# Patient Record
Sex: Male | Born: 1935 | Race: Black or African American | Hispanic: No | Smoking: Former smoker
Health system: Southern US, Community
[De-identification: ages and names within clinical notes are randomized; demographics above are authoritative.]

## PROBLEM LIST (undated history)

## (undated) DIAGNOSIS — E119 Type 2 diabetes mellitus without complications: Secondary | ICD-10-CM

## (undated) DIAGNOSIS — I10 Essential (primary) hypertension: Secondary | ICD-10-CM

## (undated) DIAGNOSIS — I509 Heart failure, unspecified: Secondary | ICD-10-CM

## (undated) DIAGNOSIS — K219 Gastro-esophageal reflux disease without esophagitis: Secondary | ICD-10-CM

## (undated) DIAGNOSIS — H409 Unspecified glaucoma: Secondary | ICD-10-CM

## (undated) HISTORY — DX: Unspecified glaucoma: H40.9

## (undated) HISTORY — PX: ABDOMINAL AORTIC ANEURYSM REPAIR: SUR1152

## (undated) HISTORY — DX: Gastro-esophageal reflux disease without esophagitis: K21.9

---

## 2001-11-14 ENCOUNTER — Emergency Department (HOSPITAL_COMMUNITY): Admission: EM | Admit: 2001-11-14 | Discharge: 2001-11-14 | Payer: Self-pay | Admitting: Emergency Medicine

## 2002-11-26 LAB — HM COLONOSCOPY

## 2006-01-14 ENCOUNTER — Emergency Department: Payer: Self-pay | Admitting: Emergency Medicine

## 2006-01-31 ENCOUNTER — Ambulatory Visit: Payer: Self-pay | Admitting: Urology

## 2012-05-25 ENCOUNTER — Emergency Department: Payer: Self-pay | Admitting: Emergency Medicine

## 2013-08-16 ENCOUNTER — Ambulatory Visit: Payer: Self-pay | Admitting: Vascular Surgery

## 2013-08-22 ENCOUNTER — Other Ambulatory Visit: Payer: Self-pay | Admitting: Vascular Surgery

## 2013-08-22 LAB — BASIC METABOLIC PANEL
Chloride: 101 mmol/L (ref 98–107)
Co2: 28 mmol/L (ref 21–32)
Creatinine: 1.49 mg/dL — ABNORMAL HIGH (ref 0.60–1.30)
EGFR (Non-African Amer.): 43 — ABNORMAL LOW
Glucose: 271 mg/dL — ABNORMAL HIGH (ref 65–99)
Potassium: 3.3 mmol/L — ABNORMAL LOW (ref 3.5–5.1)

## 2013-09-05 ENCOUNTER — Ambulatory Visit: Payer: Self-pay | Admitting: Vascular Surgery

## 2013-09-26 ENCOUNTER — Ambulatory Visit: Payer: Self-pay | Admitting: Vascular Surgery

## 2013-09-26 LAB — CBC
HGB: 12.2 g/dL — ABNORMAL LOW (ref 13.0–18.0)
MCH: 31.1 pg (ref 26.0–34.0)
MCHC: 33.5 g/dL (ref 32.0–36.0)
MCV: 93 fL (ref 80–100)

## 2013-09-26 LAB — BASIC METABOLIC PANEL
BUN: 12 mg/dL (ref 7–18)
Calcium, Total: 9.4 mg/dL (ref 8.5–10.1)
Chloride: 102 mmol/L (ref 98–107)
Co2: 30 mmol/L (ref 21–32)
EGFR (Non-African Amer.): 43 — ABNORMAL LOW
Potassium: 3.1 mmol/L — ABNORMAL LOW (ref 3.5–5.1)

## 2013-10-02 ENCOUNTER — Inpatient Hospital Stay: Payer: Self-pay | Admitting: Vascular Surgery

## 2013-10-02 LAB — BASIC METABOLIC PANEL
Anion Gap: 4 — ABNORMAL LOW (ref 7–16)
Calcium, Total: 8.5 mg/dL (ref 8.5–10.1)
Co2: 27 mmol/L (ref 21–32)
EGFR (African American): >60
EGFR (Non-African Amer.): 58 — ABNORMAL LOW
Glucose: 220 mg/dL — ABNORMAL HIGH (ref 65–99)
Osmolality: 282 (ref 275–301)
Potassium: 3.4 mmol/L — ABNORMAL LOW (ref 3.5–5.1)
Sodium: 138 mmol/L (ref 136–145)

## 2013-10-02 LAB — GLUCOSE, RANDOM: Glucose: 250 mg/dL — ABNORMAL HIGH (ref 65–99)

## 2013-10-03 LAB — PHOSPHORUS: Phosphorus: 2.7 mg/dL (ref 2.5–4.9)

## 2013-10-03 LAB — CBC WITH DIFFERENTIAL/PLATELET
Basophil #: 0 10*3/uL (ref 0.0–0.1)
Basophil %: 0.3 %
Eosinophil #: 0 10*3/uL (ref 0.0–0.7)
Eosinophil %: 0.2 %
HGB: 11.4 g/dL — ABNORMAL LOW (ref 13.0–18.0)
Lymphocyte %: 10.9 %
MCH: 31.3 pg (ref 26.0–34.0)
Monocyte %: 24.2 %
Neutrophil #: 4.6 10*3/uL (ref 1.4–6.5)
Neutrophil %: 64.4 %
RBC: 3.65 10*6/uL — ABNORMAL LOW (ref 4.40–5.90)
RDW: 12.7 % (ref 11.5–14.5)
WBC: 7.1 10*3/uL (ref 3.8–10.6)

## 2013-10-03 LAB — COMPREHENSIVE METABOLIC PANEL
Albumin: 3.1 g/dL — ABNORMAL LOW (ref 3.4–5.0)
Alkaline Phosphatase: 82 U/L (ref 50–136)
Anion Gap: 7 (ref 7–16)
Calcium, Total: 8.3 mg/dL — ABNORMAL LOW (ref 8.5–10.1)
Chloride: 105 mmol/L (ref 98–107)
Co2: 27 mmol/L (ref 21–32)
EGFR (African American): >60
Glucose: 229 mg/dL — ABNORMAL HIGH (ref 65–99)
Osmolality: 283 (ref 275–301)
SGOT(AST): 19 U/L (ref 15–37)
SGPT (ALT): 17 U/L (ref 12–78)
Sodium: 139 mmol/L (ref 136–145)
Total Protein: 6.7 g/dL (ref 6.4–8.2)

## 2013-10-03 LAB — APTT: Activated PTT: 30.7 secs (ref 23.6–35.9)

## 2013-10-03 LAB — PROTIME-INR: Prothrombin Time: 14.4 secs (ref 11.5–14.7)

## 2014-06-16 LAB — CBC AND DIFFERENTIAL
HCT: 36 % — AB (ref 41–53)
Hemoglobin: 11.8 g/dL — AB (ref 13.5–17.5)
Neutrophils Absolute: 2 /uL
Platelets: 113 10*3/uL — AB (ref 150–399)
WBC: 4.7 10*3/mL

## 2014-06-16 LAB — HEPATIC FUNCTION PANEL
ALT: 17 U/L (ref 10–40)
AST: 20 U/L (ref 14–40)
Alkaline Phosphatase: 75 U/L (ref 25–125)

## 2014-06-16 LAB — LIPID PANEL
Cholesterol: 184 mg/dL (ref 0–200)
HDL: 31 mg/dL — AB (ref 35–70)
LDL Cholesterol: 131 mg/dL
LDl/HDL Ratio: 4.2
Triglycerides: 108 mg/dL (ref 40–160)

## 2014-06-16 LAB — TSH: TSH: 1.49 u[IU]/mL (ref 0.41–5.90)

## 2014-06-16 LAB — BASIC METABOLIC PANEL: BUN: 19 mg/dL (ref 4–21)

## 2014-08-25 LAB — BASIC METABOLIC PANEL
CREATININE: 1.5 mg/dL — AB (ref <=1.3)
GLUCOSE: 215 mg/dL
Potassium: 3.5 mmol/L (ref 3.4–5.3)
Sodium: 139 mmol/L (ref 137–147)

## 2014-11-07 ENCOUNTER — Ambulatory Visit: Payer: Self-pay | Admitting: Family Medicine

## 2015-02-23 LAB — HEMOGLOBIN A1C: Hgb A1c MFr Bld: 7.1 % — AB (ref 4.0–6.0)

## 2015-03-13 NOTE — Op Note (Signed)
PATIENT NAME:  Aaron Rivera, [name] MR#:  [id number] DATE OF BIRTH:  May 30, 1930  DATE OF PROCEDURE:  10/02/2013  PREOPERATIVE DIAGNOSIS:  Abdominal aortic aneurysm.   POSTOPERATIVE DIAGNOSIS:  Abdominal aortic aneurysm.    PROCEDURES: 1.  Ultrasound guidance for vascular access to bilateral femoral arteries, right by Dr. Gilda CreaseSchnier, left by Dr. Wyn Quakerew.  2.  Catheter placement to aorta from bilateral femoral arteries, right by Dr. Gilda CreaseSchnier and left by Dr. Wyn Quakerew.  3.  Placement of a Gore Excluder endoprosthesis, 28 mm diameter proximal, 18 cm length, main body left co-surgeons.  4.  Placement of a right contralateral limb and then a right iliac extender, co-surgeons.  5.  Perclose closure device bilateral femoral arteries, right by Dr. Gilda CreaseSchnier and left by Dr. Wyn Quakerew.    CO-SURGEONS:  Annice NeedyJason S. Dreana Britz, MD, and Renford DillsGregory G. Schnier, MD  ANESTHESIA: General.   ESTIMATED BLOOD LOSS: 100 mL.   INDICATION FOR PROCEDURE: This is a gentleman with a greater than 5 cm abdominal aortic aneurysm. He had acceptable anatomy for endovascular repair.   DESCRIPTION OF PROCEDURE: The patient is brought to the vascular interventional radiology suite with the help of our anesthesia colleagues providing a general anesthetic.  His abdomen and groins were sterilely prepped and draped, and a sterile surgical field was created. Ultrasound was used to visualize the femoral arteries bilaterally. Dr. Gilda CreaseSchnier accessed the right and I accessed the left, and 6-French sheaths were placed. We used the Perclose technique, placing 2 Proglides on both femoral arteries and then up-sizing to 8-French sheaths. A pigtail catheter was placed and an AP aortogram was performed. We then selected the left for a primary site. An 18-French sheath was placed over an Amplatz Super Stiff wire and the main body device was placed up the left side. Pictures from the pigtail catheter from the right femoral sheath allowed us to place the stent graft just below the renal  arteries with the right being slightly lower. Dr. Gilda CreaseSchnier then cannulated the contralateral gate without difficulty, twirling the pigtail catheter to confirm successful cannulation.  He extended the right iliac limb but this was still about 3 to 4 cm above the hypogastric artery and a separate right iliac extension device had to be placed.  This was a 16 mm diameter device.   All junction points and seal zones were ironed out with a compliant balloon. Initially, there appeared to be an indeterminate endoleak. With re-angioplasty of the proximal seal zone, this appeared to disappear and at this point, we elected to terminate the procedure. The sheaths were removed. Additional Proglides were placed to gain hemostasis was well a StarClose device on the. Right pressure was held. Sterile dressings were placed. The patient tolerated the procedure well and was taken to the recovery room in stable condition.    ____________________________ Annice NeedyJason S. Axzel Rockhill, MD jsd:cs D: 10/02/2013 14:11:52 ET T: 10/02/2013 15:45:13 ET JOB#: [id number]  cc: Annice NeedyJason S. Avi Kerschner, MD, <Dictator> Annice NeedyJASON S Vincent Ehrler MD ELECTRONICALLY SIGNED 10/03/2013 14:48

## 2015-03-13 NOTE — Op Note (Signed)
PATIENT NAME:  Sallye OberLINDSAY, Delmus H MR#:  [id number] DATE OF BIRTH:  1930/10/02  DATE OF PROCEDURE:  10/02/2013  PREOPERATIVE DIAGNOSES:   Abdominal aortic aneurysm.   POSTOPERATIVE DIAGNOSIS: Abdominal aortic aneurysm.  PROCEDURES PERFORMED: 1.  Repair of abdominal aortic aneurysm using a Gore Excluder endoprosthesis stent graft 28 mm x 18 mm x 16 mm.  2.  Placement of right contralateral limb 14 x 16.  3.  Extension right limb with a 7 x 16 limb.  4.  Introduction catheter to aorta bilateral femoral artery approach percutaneously.  5.  Ultrasound guidance for vascular access, bilateral common femoral arteries.  6.  Perclose device bilateral femoral puncture sites.   CO-SURGEONS:   Dr. Gilda CreaseSchnier and Dr. Wyn Quakerew.   ANESTHESIA: General by LMA.   FLUIDS: Per anesthesia record.   ESTIMATED BLOOD LOSS: Minimal.   SPECIMEN: None.   CONTRAST USED: Isovue 90 mL.   FLUOROSCOPY TIME: 10.9 minutes.   INDICATIONS: Mr. Lillia AbedLindsay is an 81 year old gentleman who presented with a large aneurysm greater than 6 cm. The risks and benefits for endovascular repair were reviewed. Alternative therapies were discussed. All questions have been answered. The patient has agreed to proceed with endovascular aneurysm repair.   DESCRIPTION OF PROCEDURE: The patient is taken to special procedures, where he is positioned supine, and after adequate general anesthesia has been induced and appropriate invasive monitors have been placed, he is prepped from the nipple line to the knees and then draped in a sterile fashion. Appropriate timeout is called.   Ultrasound is placed in a sterile sleeve. Ultrasound is being utilized secondary to lack of appropriate landmarks and to avoid vascular injury. Under direct ultrasound visualization, the common femoral arteries are identified. They are echolucent and pulsatile indicating patency. Right groin is addressed first, and a micropuncture needle was used to access the anterior wall of  the common femoral artery, microwire followed by micro sheath, J-wire followed by a 6-French sheath. In a similar fashion, Dr. Wyn Quakerew performed access on the left common femoral.   J-wires are then advanced and Perclose devices are used in a pre-close fashion, taking the first device and rotating it slightly to the 11 o'clock position. This is marked with a curved hemostat. The second device is then placed over the wire and rotated to the two o'clock position and marked with a straight hemostat. Again, this was performed simultaneously, both right and left with myself working on the right and Dr. Wyn Quakerew working on the left.  8-French sheaths are then advanced over the wires.   The pigtail catheter is then advanced up the right side, and an AP projection of the aorta is obtained. Final length measurements are made. 6000 of heparin is given, and after it has circulated for 3 minutes, an Amplatz Super Stiff wire is advanced up the left sheath. A main body Gore Excluder 28 x 18 x 16 is then selected. It is advanced up to the level of the renals. Magnified images are obtained at the level of the renals, and the device is repositioned. It is then opened proximally with an excellent position. It is then secured down to the contralateral gate. Pigtail catheter was subsequently removed and exchanged over a Glidewire for a KMP catheter. Working from the right side, the gate was cannulated. Pigtail catheter was twirled within the main body and subsequently the Amplatz Super Stiff wire is advanced up the right. It should be noted that once the Amplatz was placed on the left, the 18-French  sheath was advanced and exchanged for the 8-French, and now in a similar fashion, the 8-French sheath on the right is exchanged for a 12-French sheath. The contralateral limb is selected. It is a 14 length x 16 diameter, and this is deployed at the level of the gate extending down. Magnified oblique view from the right sheath demonstrates that  it is extending into the common iliac by approximately 2 cm, and there is approximately 4 cm of common iliac proximal to the bifurcation, which is uncovered, and therefore, in order to attain a more secure seal zone, an additional 7 length x 16 limb is advanced and deployed, bringing the end of the stent graft down to the level of the iliac bifurcation on the right. Coda balloon is then used to seal proximally, as well as the right contralateral limbs. Coda balloon is then advanced up the left groin, and again, used to seal proximally, and then the left limb.   Followup angiography demonstrates what appears to be a slight blush, and the Coda balloon is advanced up the left side, and the proximal is re-angioplastied.  Followup imaging demonstrates resolution of this area in question. There is a very small type II endoleak noted, which is quite delayed and appears to be secondary to 2 lumbars.   The left sheath is then removed and the ProGlide device is secured. In order to achieve adequate hemostasis, approximately 3 more ProGlides were utilized, but ultimately with light pressure for approximately 10 minutes, hemostasis is achieved on the left. On the right, the second ProGlide worked well. The first one did not. Attempts at placing further ProGlides are unsuccessful. A StarClose device was also attempted, but this are unsuccessful, and it was elected to hold pressure for approximately 25 minutes. At the conclusion of this, there was excellent hemostasis. Safeguard devices were placed bilaterally. The patient tolerated the procedure well. There were no immediate complications. Sponge and needle counts were correct, and he was taken to the recovery area in stable condition.     ____________________________ Renford Dills, MD ggs:dmm D: 10/04/2013 10:23:00 ET T: 10/04/2013 10:54:36 ET JOB#: [id number]  cc: Renford Dills, MD, <Dictator> Renford Dills MD ELECTRONICALLY SIGNED 10/28/2013 17:15

## 2015-03-20 ENCOUNTER — Ambulatory Visit
Admit: 2015-03-20 | Disposition: A | Discharge status: Home / Self Care | Payer: Self-pay | Attending: Otolaryngology | Admitting: Otolaryngology

## 2015-03-25 ENCOUNTER — Other Ambulatory Visit: Payer: Self-pay | Admitting: Otolaryngology

## 2015-03-25 DIAGNOSIS — E041 Nontoxic single thyroid nodule: Secondary | ICD-10-CM

## 2015-06-16 DIAGNOSIS — I10 Essential (primary) hypertension: Secondary | ICD-10-CM | POA: Insufficient documentation

## 2015-06-16 DIAGNOSIS — I714 Abdominal aortic aneurysm, without rupture, unspecified: Secondary | ICD-10-CM | POA: Insufficient documentation

## 2015-06-16 DIAGNOSIS — M1990 Unspecified osteoarthritis, unspecified site: Secondary | ICD-10-CM | POA: Insufficient documentation

## 2015-06-16 DIAGNOSIS — Q245 Malformation of coronary vessels: Secondary | ICD-10-CM | POA: Insufficient documentation

## 2015-06-16 DIAGNOSIS — I351 Nonrheumatic aortic (valve) insufficiency: Secondary | ICD-10-CM | POA: Insufficient documentation

## 2015-06-16 DIAGNOSIS — J309 Allergic rhinitis, unspecified: Secondary | ICD-10-CM | POA: Insufficient documentation

## 2015-06-16 DIAGNOSIS — J302 Other seasonal allergic rhinitis: Secondary | ICD-10-CM | POA: Insufficient documentation

## 2015-06-16 DIAGNOSIS — K219 Gastro-esophageal reflux disease without esophagitis: Secondary | ICD-10-CM | POA: Insufficient documentation

## 2015-06-16 DIAGNOSIS — E119 Type 2 diabetes mellitus without complications: Secondary | ICD-10-CM | POA: Insufficient documentation

## 2015-06-16 DIAGNOSIS — E785 Hyperlipidemia, unspecified: Secondary | ICD-10-CM | POA: Insufficient documentation

## 2015-06-16 DIAGNOSIS — N401 Enlarged prostate with lower urinary tract symptoms: Secondary | ICD-10-CM | POA: Insufficient documentation

## 2015-06-16 DIAGNOSIS — N529 Male erectile dysfunction, unspecified: Secondary | ICD-10-CM | POA: Insufficient documentation

## 2015-06-29 ENCOUNTER — Encounter: Payer: Self-pay | Admitting: Family Medicine

## 2015-06-29 ENCOUNTER — Ambulatory Visit (INDEPENDENT_AMBULATORY_CARE_PROVIDER_SITE_OTHER): Payer: Medicare HMO | Admitting: Family Medicine

## 2015-06-29 VITALS — BP 114/52 | HR 84 | Temp 97.8°F | Resp 16 | Ht 72.0 in | Wt 222.0 lb

## 2015-06-29 DIAGNOSIS — E119 Type 2 diabetes mellitus without complications: Secondary | ICD-10-CM | POA: Diagnosis not present

## 2015-06-29 DIAGNOSIS — Z658 Other specified problems related to psychosocial circumstances: Secondary | ICD-10-CM | POA: Diagnosis not present

## 2015-06-29 DIAGNOSIS — R1310 Dysphagia, unspecified: Secondary | ICD-10-CM

## 2015-06-29 DIAGNOSIS — I10 Essential (primary) hypertension: Secondary | ICD-10-CM

## 2015-06-29 DIAGNOSIS — E785 Hyperlipidemia, unspecified: Secondary | ICD-10-CM | POA: Diagnosis not present

## 2015-06-29 DIAGNOSIS — F439 Reaction to severe stress, unspecified: Secondary | ICD-10-CM

## 2015-06-29 NOTE — Progress Notes (Signed)
Patient ID: Aaron Rivera, male   DOB: 12-21-1929, 81 y.o.   MRN: [id number]   MONG NEAL  MRN: [id number] DOB: Dec 12, 1929  Subjective:  HPI   1. Essential hypertension Patient is an 81 year old male who presents for his 6 month follow up.  His last visit was 02/23/15.  His blood pressure at that time was 106/42.  No changes in management at that time.  2. Type 2 diabetes mellitus without complication Patient is also here to follow up on his diabetes.  His last A1C was on 02/23/15 and was 7.1.  He reports no hypoglycemic symptoms and states his glucose runs about the same all the time and said that it is around 160.  Patient states that the insurance nurse came to his house on 06/25/15 and performed a foot exam and reported it as normal.  3. Dysphagia Patient is on Omeprazole and Ranitidine.  He is uncertain of the exact dose of the Zantac.  He states that he is seeing Dr. Virgia Land and has follow up appointment with him,  4. Hyperlipidemia Patient had his last check 1 year ago.  He is on Pravastatin and reports no difficulties.    5. Stress Patient reports that he has not been doing well lately.  He states that his older brother is dying and it has him worried.   Patient Active Problem List   Diagnosis Date Noted  . AAA (abdominal aortic aneurysm) 06/16/2015  . Allergic rhinitis 06/16/2015  . AI (aortic incompetence) 06/16/2015  . Benign prostatic hypertrophy with lower urinary tract symptoms (LUTS) 06/16/2015  . Coronary artery abnormality 06/16/2015  . ED (erectile dysfunction) of organic origin 06/16/2015  . Essential (primary) hypertension 06/16/2015  . Acid reflux 06/16/2015  . HLD (hyperlipidemia) 06/16/2015  . Arthritis, degenerative 06/16/2015  . Allergic rhinitis, seasonal 06/16/2015  . Diabetes mellitus, type 2 06/16/2015    No past medical history on file.  History   Social History  . Marital Status: Married    Spouse Name: N/A  . Number of Children: N/A  .  Years of Education: N/A   Occupational History  . Not on file.   Social History Main Topics  . Smoking status: Former Research scientist (life sciences)  . Smokeless tobacco: Former Systems developer    Quit date: 11/20/1969  . Alcohol Use: No  . Drug Use: No  . Sexual Activity: Not on file   Other Topics Concern  . Not on file   Social History Narrative    Outpatient Prescriptions Prior to Visit  Medication Sig Dispense Refill  . captopril (CAPOTEN) 25 MG tablet Take by mouth.    . diltiazem (CARTIA XT) 300 MG 24 hr capsule Take by mouth.    . fluticasone (FLONASE) 50 MCG/ACT nasal spray Place into the nose.    . hydrochlorothiazide (HYDRODIURIL) 25 MG tablet Take by mouth.    . meclizine (ANTIVERT) 25 MG tablet Take by mouth.    Marland Kitchen omeprazole (PRILOSEC) 20 MG capsule Take by mouth.    . pioglitazone (ACTOS) 15 MG tablet Take by mouth.    . potassium chloride (KLOR-CON 10) 10 MEQ tablet Take by mouth.    . pravastatin (PRAVACHOL) 40 MG tablet Take by mouth.    . ranitidine (ZANTAC) 150 MG tablet Take by mouth.    . terazosin (HYTRIN) 2 MG capsule Take by mouth.     No facility-administered medications prior to visit.    No Known Allergies  Review of Systems  Constitutional: Negative.  Respiratory: Negative.   Cardiovascular: Negative.   Neurological: Negative for dizziness and headaches.   Objective:  BP 114/52 mmHg  Pulse 84  Temp(Src) 97.8 F (36.6 C) (Oral)  Resp 16  Ht 6' (1.829 m)  Wt 222 lb (100.699 kg)  BMI 30.10 kg/m2  Physical Exam  Constitutional: He is well-developed, well-nourished, and in no distress.  Neck: Normal range of motion. Neck supple.  Cardiovascular: Normal rate, regular rhythm and normal heart sounds.   Pulmonary/Chest: Effort normal and breath sounds normal.  Skin: Skin is warm and dry.  Psychiatric: Mood, memory, affect and judgment normal.    Assessment and Plan :   1. Essential hypertension Under very good control, continue present regiment.  CBC, Met C and TSH  pending.  - CBC With Differential/Platelet - COMPLETE METABOLIC PANEL WITH GFR - TSH  2. Type 2 diabetes mellitus without complication Continue present regiment.   A1C results pending. - Hemoglobin A1c Normal recent foot exam by nurse from [facility]. 3. Dysphagia Patient is being seen by Dr. Richardson Landry. This appears to be mainly with pills. No problems with liquids and no problems with solid foods.  4. Hyperlipidemia Continue present regiment.  Lipids pending.  - Lipid Panel With LDL/HDL Ratio  5. Stress Patient with appropriate emotion given the situation with dying brother. Brother is 68. Patient is comfortable with the situation. 6. Status post thoracic aortic aneurysm repair. 7. Claudication Classic claudication reported by patient with pain in the calves bilaterally with walking. This goes away with rest. He is followed by vascular surgery for this. Miguel Aschoff MD [facility] 06/29/2015 8:57 AM

## 2015-07-01 LAB — LIPID PANEL WITH LDL/HDL RATIO
Cholesterol, Total: 180 mg/dL (ref 100–199)
HDL: 30 mg/dL — ABNORMAL LOW (ref >39)
LDL Calculated: 128 mg/dL — ABNORMAL HIGH (ref 0–99)
LDl/HDL Ratio: 4.3 ratio units — ABNORMAL HIGH (ref 0.0–3.6)
Triglycerides: 112 mg/dL (ref 0–149)
VLDL Cholesterol Cal: 22 mg/dL (ref 5–40)

## 2015-07-01 LAB — CBC WITH DIFFERENTIAL/PLATELET
BASOS: 0 %
Basophils Absolute: 0 10*3/uL (ref 0.0–0.2)
EOS (ABSOLUTE): 0 10*3/uL (ref 0.0–0.4)
EOS: 0 %
HEMATOCRIT: 34.9 % — AB (ref 37.5–51.0)
HEMOGLOBIN: 11.1 g/dL — AB (ref 12.6–17.7)
Immature Grans (Abs): 0 10*3/uL (ref 0.0–0.1)
Immature Granulocytes: 0 %
Lymphocytes Absolute: 1.9 10*3/uL (ref 0.7–3.1)
Lymphs: 33 %
MCH: 30.8 pg (ref 26.6–33.0)
MCHC: 31.8 g/dL (ref 31.5–35.7)
MCV: 97 fL (ref 79–97)
Monocytes Absolute: 1.1 10*3/uL — ABNORMAL HIGH (ref 0.1–0.9)
Monocytes: 19 %
NEUTROS ABS: 2.7 10*3/uL (ref 1.4–7.0)
Neutrophils: 48 %
Platelets: 115 10*3/uL — ABNORMAL LOW (ref 150–379)
RBC: 3.6 x10E6/uL — AB (ref 4.14–5.80)
RDW: 13 % (ref 12.3–15.4)
WBC: 5.7 10*3/uL (ref 3.4–10.8)

## 2015-07-01 LAB — HEMOGLOBIN A1C
Est. average glucose Bld gHb Est-mCnc: 143 mg/dL
Hgb A1c MFr Bld: 6.6 % — ABNORMAL HIGH (ref 4.8–5.6)

## 2015-07-01 LAB — TSH: TSH: 2.56 u[IU]/mL (ref 0.450–4.500)

## 2015-08-18 ENCOUNTER — Other Ambulatory Visit: Payer: Self-pay | Admitting: Family Medicine

## 2015-09-07 DIAGNOSIS — E669 Obesity, unspecified: Secondary | ICD-10-CM | POA: Diagnosis not present

## 2015-09-07 DIAGNOSIS — I70213 Atherosclerosis of native arteries of extremities with intermittent claudication, bilateral legs: Secondary | ICD-10-CM | POA: Diagnosis not present

## 2015-09-07 DIAGNOSIS — I70219 Atherosclerosis of native arteries of extremities with intermittent claudication, unspecified extremity: Secondary | ICD-10-CM | POA: Diagnosis not present

## 2015-09-07 DIAGNOSIS — I739 Peripheral vascular disease, unspecified: Secondary | ICD-10-CM | POA: Diagnosis not present

## 2015-09-07 DIAGNOSIS — I714 Abdominal aortic aneurysm, without rupture: Secondary | ICD-10-CM | POA: Diagnosis not present

## 2015-09-07 DIAGNOSIS — E785 Hyperlipidemia, unspecified: Secondary | ICD-10-CM | POA: Diagnosis not present

## 2015-09-07 DIAGNOSIS — I10 Essential (primary) hypertension: Secondary | ICD-10-CM | POA: Diagnosis not present

## 2015-09-07 DIAGNOSIS — I6529 Occlusion and stenosis of unspecified carotid artery: Secondary | ICD-10-CM | POA: Diagnosis not present

## 2015-09-08 ENCOUNTER — Other Ambulatory Visit: Payer: Self-pay | Admitting: Family Medicine

## 2015-10-19 ENCOUNTER — Other Ambulatory Visit: Payer: Self-pay | Admitting: Family Medicine

## 2015-11-02 ENCOUNTER — Ambulatory Visit (INDEPENDENT_AMBULATORY_CARE_PROVIDER_SITE_OTHER): Payer: Medicare HMO | Admitting: Family Medicine

## 2015-11-02 VITALS — BP 114/68 | HR 84 | Temp 98.3°F | Resp 14 | Ht 72.0 in | Wt 227.0 lb

## 2015-11-02 DIAGNOSIS — E119 Type 2 diabetes mellitus without complications: Secondary | ICD-10-CM | POA: Diagnosis not present

## 2015-11-02 DIAGNOSIS — M25512 Pain in left shoulder: Secondary | ICD-10-CM | POA: Diagnosis not present

## 2015-11-02 DIAGNOSIS — I10 Essential (primary) hypertension: Secondary | ICD-10-CM | POA: Diagnosis not present

## 2015-11-02 LAB — POCT GLYCOSYLATED HEMOGLOBIN (HGB A1C): HEMOGLOBIN A1C: 6.6

## 2015-11-02 MED ORDER — GLUCOSE BLOOD VI STRP: 1.0000 | ORAL_STRIP | Freq: Two times a day (BID) | Status: DC

## 2015-11-02 NOTE — Progress Notes (Signed)
Patient ID: Aaron Rivera, male   DOB: 1930-01-07, 81 y.o.   MRN: [id number]   Aaron Rivera  MRN: [id number] DOB: 1930/03/03  Subjective:  HPI   1. Type 2 diabetes mellitus without complication, without long-term current use of insulin [facility]) The patient is an 81 year old male who presents for follow up of his diabetes.  His last visit was on 06/29/15.  No management changes were made at that time.  The patient has been checking his glucose at home daily.  He states that they have been running from 140-175.  He feels these are inaccurate and that his machine is not working correctly.  His last A1C was on 06/29/15 and was 6.6.  The patient is currently on Actos and reports good compliance and tolerance.  2. Essential hypertension The patient is also here to follow up on his blood pressure.  He reports that he does check his pressure occasionally when he is in the pharmacy and feels that it runs ok.  The patient wanted [place] to know that ENT is following him for his thyroid.  When he was hospitalized for his stent placement he had some abnormal findings involving his thyroid and was sent to ENT.  He states that they have not started any medications and he is doing ok with the thyroid now.  He has follow up with them in the near future but, is unsure of the date at this time.   Patient Active Problem List   Diagnosis Date Noted  . AAA (abdominal aortic aneurysm) (HCC) 06/16/2015  . Allergic rhinitis 06/16/2015  . AI (aortic incompetence) 06/16/2015  . Benign prostatic hypertrophy with lower urinary tract symptoms (LUTS) 06/16/2015  . Coronary artery abnormality 06/16/2015  . ED (erectile dysfunction) of organic origin 06/16/2015  . Essential (primary) hypertension 06/16/2015  . Acid reflux 06/16/2015  . HLD (hyperlipidemia) 06/16/2015  . Arthritis, degenerative 06/16/2015  . Allergic rhinitis, seasonal 06/16/2015  . Diabetes mellitus, type 2 (HCC) 06/16/2015    No past medical history on  file.  Social History   Social History  . Marital Status: Married    Spouse Name: N/A  . Number of Children: N/A  . Years of Education: N/A   Occupational History  . Not on file.   Social History Main Topics  . Smoking status: Former Games developer  . Smokeless tobacco: Former Neurosurgeon    Quit date: 11/20/1969  . Alcohol Use: No  . Drug Use: No  . Sexual Activity: Not on file   Other Topics Concern  . Not on file   Social History Narrative    Outpatient Prescriptions Prior to Visit  Medication Sig Dispense Refill  . captopril (CAPOTEN) 25 MG tablet Take by mouth.    . diltiazem (CARTIA XT) 300 MG 24 hr capsule Take by mouth.    . fluticasone (FLONASE) 50 MCG/ACT nasal spray Place into the nose.    . hydrochlorothiazide (HYDRODIURIL) 25 MG tablet Take by mouth.    Marland Kitchen KLOR-CON 10 10 MEQ tablet TAKE 2 TABLETS EVERY DAY 90 tablet 3  . meclizine (ANTIVERT) 25 MG tablet Take by mouth.    Marland Kitchen omeprazole (PRILOSEC) 20 MG capsule Take by mouth.    . pioglitazone (ACTOS) 15 MG tablet Take by mouth.    . pravastatin (PRAVACHOL) 40 MG tablet TAKE 1 TABLET BY MOUTH AT BEDTIME 90 tablet 3  . ranitidine (ZANTAC) 150 MG tablet TAKE 1 TABLET BY MOUTH AT BEDTIME 30 tablet 5  .  terazosin (HYTRIN) 2 MG capsule TAKE ONE CAPSULE BY MOUTH DAILY 30 capsule 11  . doxycycline (ADOXA) 100 MG tablet TAKE 1 TABLET BY MOUTH TWICE A DAY 14 tablet 0   No facility-administered medications prior to visit.    No Known Allergies  Review of Systems  Constitutional: Negative for fever and chills.  Respiratory: Negative for cough, shortness of breath and wheezing.   Cardiovascular: Negative for chest pain, palpitations and leg swelling.  Gastrointestinal: Negative.   Musculoskeletal: Positive for joint pain.       Left shoulder pain with movement of the left shoulder. This does not occur with walking. No other symptoms at this could be cardiovascular/angina.  Neurological: Negative.  Negative for weakness.    Endo/Heme/Allergies: Negative.   Psychiatric/Behavioral: Negative.    Objective:  BP 114/68 mmHg  Pulse 84  Temp(Src) 98.3 F (36.8 C) (Oral)  Resp 14  Ht 6' (1.829 m)  Wt 227 lb (102.967 kg)  BMI 30.78 kg/m2  Physical Exam  Constitutional: He is oriented to person, place, and time and well-developed, well-nourished, and in no distress.  Neck: Normal range of motion. Neck supple.  Cardiovascular: Normal rate, regular rhythm and normal heart sounds.   Pulmonary/Chest: Effort normal and breath sounds normal.  Abdominal: Soft.  Musculoskeletal: He exhibits tenderness. He exhibits no edema.  Mild tenderness over anterior left shoulder. FROM  present  Neurological: He is alert and oriented to person, place, and time. Gait normal.  Skin: Skin is warm and dry.  Psychiatric: Mood, memory, affect and judgment normal.    Assessment and Plan :   1. Type 2 diabetes mellitus without complication, without long-term current use of insulin (HCC)  - Hemoglobin A1C-6.6 today  - glucose blood test strip; 1 each by Other route 2 (two) times daily. Use as instructed  Dispense: 100 each; Refill: 12  2. Essential hypertension Stable no changes  3. Left shoulder pain/rotator cuff syndrome Offered referral to ortho, patient declines at this time. 4.CAD All risk factors treated. 5. Hyperlipidemia 6. Status post AAA repair in 2014 7.GERD Julieanne Mansonichard Gilbert MD [facility] 11/02/2015 8:59 AM

## 2015-11-12 DIAGNOSIS — R69 Illness, unspecified: Secondary | ICD-10-CM | POA: Diagnosis not present

## 2015-11-27 ENCOUNTER — Other Ambulatory Visit: Payer: Self-pay | Admitting: Family Medicine

## 2015-12-01 DIAGNOSIS — H18413 Arcus senilis, bilateral: Secondary | ICD-10-CM | POA: Diagnosis not present

## 2015-12-01 DIAGNOSIS — H5200 Hypermetropia, unspecified eye: Secondary | ICD-10-CM | POA: Diagnosis not present

## 2015-12-01 DIAGNOSIS — E109 Type 1 diabetes mellitus without complications: Secondary | ICD-10-CM | POA: Diagnosis not present

## 2015-12-01 DIAGNOSIS — I10 Essential (primary) hypertension: Secondary | ICD-10-CM | POA: Diagnosis not present

## 2015-12-01 DIAGNOSIS — Z0100 Encounter for examination of eyes and vision without abnormal findings: Secondary | ICD-10-CM | POA: Diagnosis not present

## 2015-12-16 ENCOUNTER — Other Ambulatory Visit: Payer: Self-pay | Admitting: Family Medicine

## 2015-12-16 DIAGNOSIS — I10 Essential (primary) hypertension: Secondary | ICD-10-CM

## 2016-01-13 ENCOUNTER — Other Ambulatory Visit: Payer: Self-pay | Admitting: Family Medicine

## 2016-01-27 DIAGNOSIS — E041 Nontoxic single thyroid nodule: Secondary | ICD-10-CM | POA: Diagnosis not present

## 2016-01-27 DIAGNOSIS — J301 Allergic rhinitis due to pollen: Secondary | ICD-10-CM | POA: Diagnosis not present

## 2016-01-27 DIAGNOSIS — K219 Gastro-esophageal reflux disease without esophagitis: Secondary | ICD-10-CM | POA: Diagnosis not present

## 2016-02-23 ENCOUNTER — Ambulatory Visit (INDEPENDENT_AMBULATORY_CARE_PROVIDER_SITE_OTHER): Payer: Medicare HMO | Admitting: Family Medicine

## 2016-02-23 ENCOUNTER — Encounter: Payer: Self-pay | Admitting: Family Medicine

## 2016-02-23 VITALS — BP 116/68 | HR 82 | Temp 97.8°F | Resp 16 | Wt 223.0 lb

## 2016-02-23 DIAGNOSIS — M1990 Unspecified osteoarthritis, unspecified site: Secondary | ICD-10-CM

## 2016-02-23 DIAGNOSIS — K219 Gastro-esophageal reflux disease without esophagitis: Secondary | ICD-10-CM | POA: Diagnosis not present

## 2016-02-23 DIAGNOSIS — E119 Type 2 diabetes mellitus without complications: Secondary | ICD-10-CM

## 2016-02-23 DIAGNOSIS — E785 Hyperlipidemia, unspecified: Secondary | ICD-10-CM | POA: Diagnosis not present

## 2016-02-23 DIAGNOSIS — I10 Essential (primary) hypertension: Secondary | ICD-10-CM | POA: Diagnosis not present

## 2016-02-23 LAB — POCT GLYCOSYLATED HEMOGLOBIN (HGB A1C): Hemoglobin A1C: 6.9

## 2016-02-23 NOTE — Progress Notes (Signed)
Patient ID: Aaron Rivera, male   DOB: 07/07/1930, 82 y.o.   MRN: [id number]    Subjective:  HPI  Diabetes Mellitus Type II, Follow-up:   Lab Results  Component Value Date   HGBA1C 6.6 11/02/2015   HGBA1C 6.6* 06/29/2015   HGBA1C 7.1* 02/23/2015    Last seen for diabetes 4 months ago.  Management since then includes none. He reports good compliance with treatment. He is not having side effects.  Current symptoms include none and have been unchanged. Home blood sugar records: 130's-140's  Episodes of hypoglycemia? no   Current Insulin Regimen: n/a Most Recent Eye Exam: last week, he will be having a cataract removed. Current exercise: none  Pertinent Labs:    Component Value Date/Time   CHOL 180 06/29/2015 0935   CHOL 184 06/16/2014   TRIG 112 06/29/2015 0935   HDL 30* 06/29/2015 0935   HDL 31* 06/16/2014   LDLCALC 128* 06/29/2015 0935   LDLCALC 131 06/16/2014   CREATININE 1.5* 08/25/2014   CREATININE 1.25 10/03/2013 0411    Wt Readings from Last 3 Encounters:  02/23/16 223 lb (101.152 kg)  11/02/15 227 lb (102.967 kg)  06/29/15 222 lb (100.699 kg)    ------------------------------------------------------------------------    Hypertension, follow-up:  BP Readings from Last 3 Encounters:  02/23/16 116/68  11/02/15 114/68  06/29/15 114/52    He was last seen for hypertension 3 months ago.  BP at that visit was 114/68. Management since that visit includes none. He reports good compliance with treatment. He is not having side effects.  He is not exercising. He is adherent to low salt diet.   Outside blood pressures are not being checked. Every once in a while he will get it checked at the pharmacy. He is experiencing none.  Patient denies chest pain, chest pressure/discomfort, claudication, dyspnea, exertional chest pressure/discomfort, fatigue, irregular heart beat, lower extremity edema, near-syncope, orthopnea, palpitations and paroxysmal nocturnal  dyspnea.   Cardiovascular risk factors include advanced age (older than 8455 for men, 5265 for women), diabetes mellitus, dyslipidemia, hypertension, male gender, sedentary lifestyle and smoking/ tobacco exposure.   Wt Readings from Last 3 Encounters:  02/23/16 223 lb (101.152 kg)  11/02/15 227 lb (102.967 kg)  06/29/15 222 lb (100.699 kg)  ------------------------------------------------------------------------     Prior to Admission medications   Medication Sig Start Date End Date Taking? Authorizing Provider  captopril (CAPOTEN) 25 MG tablet TAKE 1/2 TABLET BY MOUTH TWICE A DAY 01/13/16  Yes Richard Hulen ShoutsL Gilbert Jr., MD  diltiazem (CARDIZEM CD) 300 MG 24 hr capsule TAKE 1 CAPSULE EVERY DAY 12/16/15  Yes Maple Hudsonichard L Gilbert Jr., MD  fluticasone [facility](FLONASE) 50 MCG/ACT nasal spray Place into the nose. 02/23/15  Yes Historical Provider, MD  glucose blood test strip 1 each by Other route 2 (two) times daily. Use as instructed 11/02/15  Yes Richard Hulen ShoutsL Gilbert Jr., MD  hydrochlorothiazide (HYDRODIURIL) 25 MG tablet TAKE 1 TABLET EVERY DAY 12/16/15  Yes Maple Hudsonichard L Gilbert Jr., MD  KLOR-CON 10 10 MEQ tablet TAKE 2 TABLETS EVERY DAY 10/22/15  Yes Maple Hudsonichard L Gilbert Jr., MD  meclizine (ANTIVERT) 25 MG tablet Take by mouth. 08/25/14  Yes Historical Provider, MD  omeprazole (PRILOSEC) 20 MG capsule Take by mouth. 01/27/15  Yes Historical Provider, MD  pioglitazone (ACTOS) 15 MG tablet TAKE 1 TABLET BY MOUTH IN THE MORNING 11/27/15  Yes Richard Hulen ShoutsL Gilbert Jr., MD  pravastatin (PRAVACHOL) 40 MG tablet TAKE 1 TABLET BY MOUTH AT BEDTIME 09/08/15  Yes  Richard Hulen Shouts., MD  ranitidine (ZANTAC) 150 MG tablet TAKE 1 TABLET BY MOUTH AT BEDTIME 08/18/15  Yes Richard Hulen Shouts., MD  terazosin (HYTRIN) 2 MG capsule TAKE ONE CAPSULE BY MOUTH DAILY 08/18/15  Yes Maple Hudson., MD    Patient Active Problem List   Diagnosis Date Noted  . AAA (abdominal aortic aneurysm) (HCC) 06/16/2015  . Allergic rhinitis 06/16/2015  .  AI (aortic incompetence) 06/16/2015  . Benign prostatic hypertrophy with lower urinary tract symptoms (LUTS) 06/16/2015  . Coronary artery abnormality 06/16/2015  . ED (erectile dysfunction) of organic origin 06/16/2015  . Essential (primary) hypertension 06/16/2015  . Acid reflux 06/16/2015  . HLD (hyperlipidemia) 06/16/2015  . Arthritis, degenerative 06/16/2015  . Allergic rhinitis, seasonal 06/16/2015  . Diabetes mellitus, type 2 (HCC) 06/16/2015    History reviewed. No pertinent past medical history.  Social History   Social History  . Marital Status: Married    Spouse Name: N/A  . Number of Children: N/A  . Years of Education: N/A   Occupational History  . Not on file.   Social History Main Topics  . Smoking status: Former Games developer  . Smokeless tobacco: Former Neurosurgeon    Quit date: 11/20/1969  . Alcohol Use: No  . Drug Use: No  . Sexual Activity: Not on file   Other Topics Concern  . Not on file   Social History Narrative    No Known Allergies  Review of Systems  Constitutional: Negative.   HENT: Negative.   Eyes: Negative.   Respiratory: Negative.   Cardiovascular: Negative.   Gastrointestinal: Negative.   Genitourinary: Negative.   Musculoskeletal: Positive for joint pain.  Skin: Negative.   Neurological: Negative.   Endo/Heme/Allergies: Negative.   Psychiatric/Behavioral: Negative.     Immunization History  Administered Date(s) Administered  . Pneumococcal Conjugate-13 08/25/2014  . Pneumococcal Polysaccharide-23 09/11/2002  . Td 07/05/2007   Objective:  BP 116/68 mmHg  Pulse 82  Temp(Src) 97.8 F (36.6 C) (Oral)  Resp 16  Wt 223 lb (101.152 kg)  Physical Exam  Constitutional: He is oriented to person, place, and time and well-developed, well-nourished, and in no distress.  Eyes: Conjunctivae and EOM are normal. Pupils are equal, round, and reactive to light.  Neck: Normal range of motion. Neck supple.  Cardiovascular: Normal rate, regular  rhythm, normal heart sounds and intact distal pulses.   Pulmonary/Chest: Effort normal and breath sounds normal.  Musculoskeletal: Normal range of motion.  Neurological: He is alert and oriented to person, place, and time. He has normal reflexes. Gait normal. GCS score is 15.  Skin: Skin is warm and dry.  Psychiatric: Mood, memory, affect and judgment normal.   Diabetic Foot Exam - Simple   Simple Foot Form  Diabetic Foot exam was performed with the following findings:  Yes 02/23/2016  8:51 AM  Visual Inspection  No deformities, no ulcerations, no other skin breakdown bilaterally:  Yes  Sensation Testing  Intact to touch and monofilament testing bilaterally:  Yes  Pulse Check  Posterior Tibialis and Dorsalis pulse intact bilaterally:  Yes  Comments       Lab Results  Component Value Date   WBC 5.7 06/29/2015   HGB 11.8* 06/16/2014   HCT 34.9* 06/29/2015   PLT 115* 06/29/2015   GLUCOSE 229* 10/03/2013   CHOL 180 06/29/2015   TRIG 112 06/29/2015   HDL 30* 06/29/2015   LDLCALC 128* 06/29/2015   TSH 2.560 06/29/2015   INR 1.1  10/03/2013   HGBA1C 6.6 11/02/2015    CMP     Component Value Date/Time   NA 139 08/25/2014   NA 139 10/03/2013 0411   K 3.5 08/25/2014   K 3.8 10/03/2013 0411   CL 105 10/03/2013 0411   CO2 27 10/03/2013 0411   GLUCOSE 229* 10/03/2013 0411   BUN 19 06/16/2014   BUN 9 10/03/2013 0411   CREATININE 1.5* 08/25/2014   CREATININE 1.25 10/03/2013 0411   CALCIUM 8.3* 10/03/2013 0411   PROT 6.7 10/03/2013 0411   ALBUMIN 3.1* 10/03/2013 0411   AST 20 06/16/2014   AST 19 10/03/2013 0411   ALT 17 06/16/2014   ALT 17 10/03/2013 0411   ALKPHOS 75 06/16/2014   ALKPHOS 82 10/03/2013 0411   BILITOT 0.5 10/03/2013 0411   GFRNONAA 54* 10/03/2013 0411   GFRAA >60 10/03/2013 0411    Assessment and Plan :   1. Type 2 diabetes mellitus without complication, without long-term current use of insulin (HCC) Diabetic foot exam normal today. Monofilament  used. - POCT HgB A1C 6.9 today.   2. HLD (hyperlipidemia)   3. Essential (primary) hypertension Stable.   4. Osteoarthritis, unspecified osteoarthritis type, unspecified site   5. Gastroesophageal reflux disease, esophagitis presence not specified 6 CAD All risk factors treated I have done the exam and reviewed the above chart and it is accurate to the best of my knowledge.   Patient was seen and examined by Dr. Julieanne Manson, and noted scribed by Dimas Chyle, CMA   Julieanne Manson MD [facility] 02/23/2016 8:35 AM

## 2016-03-01 DIAGNOSIS — H25812 Combined forms of age-related cataract, left eye: Secondary | ICD-10-CM | POA: Diagnosis not present

## 2016-03-01 DIAGNOSIS — H25811 Combined forms of age-related cataract, right eye: Secondary | ICD-10-CM | POA: Diagnosis not present

## 2016-03-01 DIAGNOSIS — H401132 Primary open-angle glaucoma, bilateral, moderate stage: Secondary | ICD-10-CM | POA: Diagnosis not present

## 2016-03-10 ENCOUNTER — Other Ambulatory Visit: Payer: Self-pay | Admitting: Family Medicine

## 2016-03-10 DIAGNOSIS — H2512 Age-related nuclear cataract, left eye: Secondary | ICD-10-CM | POA: Diagnosis not present

## 2016-03-10 DIAGNOSIS — H25812 Combined forms of age-related cataract, left eye: Secondary | ICD-10-CM | POA: Diagnosis not present

## 2016-03-10 DIAGNOSIS — Z961 Presence of intraocular lens: Secondary | ICD-10-CM | POA: Diagnosis not present

## 2016-03-14 DIAGNOSIS — I70213 Atherosclerosis of native arteries of extremities with intermittent claudication, bilateral legs: Secondary | ICD-10-CM | POA: Diagnosis not present

## 2016-03-14 DIAGNOSIS — I714 Abdominal aortic aneurysm, without rupture: Secondary | ICD-10-CM | POA: Diagnosis not present

## 2016-03-14 DIAGNOSIS — I6529 Occlusion and stenosis of unspecified carotid artery: Secondary | ICD-10-CM | POA: Diagnosis not present

## 2016-03-14 DIAGNOSIS — E669 Obesity, unspecified: Secondary | ICD-10-CM | POA: Diagnosis not present

## 2016-03-14 DIAGNOSIS — I6523 Occlusion and stenosis of bilateral carotid arteries: Secondary | ICD-10-CM | POA: Diagnosis not present

## 2016-03-14 DIAGNOSIS — E785 Hyperlipidemia, unspecified: Secondary | ICD-10-CM | POA: Diagnosis not present

## 2016-03-14 DIAGNOSIS — I70219 Atherosclerosis of native arteries of extremities with intermittent claudication, unspecified extremity: Secondary | ICD-10-CM | POA: Diagnosis not present

## 2016-03-14 DIAGNOSIS — I739 Peripheral vascular disease, unspecified: Secondary | ICD-10-CM | POA: Diagnosis not present

## 2016-03-14 DIAGNOSIS — I10 Essential (primary) hypertension: Secondary | ICD-10-CM | POA: Diagnosis not present

## 2016-03-24 ENCOUNTER — Ambulatory Visit
Admission: RE | Admit: 2016-03-24 | Discharge: 2016-03-24 | Disposition: A | Discharge status: Home / Self Care | Payer: Medicare HMO | Source: Ambulatory Visit | Attending: Otolaryngology | Admitting: Otolaryngology

## 2016-03-24 DIAGNOSIS — E041 Nontoxic single thyroid nodule: Secondary | ICD-10-CM | POA: Diagnosis not present

## 2016-03-25 DIAGNOSIS — R69 Illness, unspecified: Secondary | ICD-10-CM | POA: Diagnosis not present

## 2016-03-29 DIAGNOSIS — H25811 Combined forms of age-related cataract, right eye: Secondary | ICD-10-CM | POA: Diagnosis not present

## 2016-03-29 DIAGNOSIS — H2511 Age-related nuclear cataract, right eye: Secondary | ICD-10-CM | POA: Diagnosis not present

## 2016-03-29 DIAGNOSIS — Z961 Presence of intraocular lens: Secondary | ICD-10-CM | POA: Diagnosis not present

## 2016-04-10 ENCOUNTER — Other Ambulatory Visit: Payer: Self-pay | Admitting: Family Medicine

## 2016-07-11 DIAGNOSIS — R69 Illness, unspecified: Secondary | ICD-10-CM | POA: Diagnosis not present

## 2016-07-27 ENCOUNTER — Other Ambulatory Visit: Payer: Self-pay

## 2016-07-27 MED ORDER — TERAZOSIN HCL 2 MG PO CAPS: 2.0000 mg | ORAL_CAPSULE | Freq: Every day | ORAL | 3 refills | Status: DC

## 2016-07-27 NOTE — Telephone Encounter (Signed)
[facility] Ter[facility] please review-aa

## 2016-07-28 ENCOUNTER — Encounter: Payer: Self-pay | Admitting: Family Medicine

## 2016-07-28 ENCOUNTER — Ambulatory Visit (INDEPENDENT_AMBULATORY_CARE_PROVIDER_SITE_OTHER): Payer: Medicare HMO | Admitting: Family Medicine

## 2016-07-28 VITALS — BP 106/60 | HR 86 | Temp 97.7°F | Resp 14 | Ht 69.5 in | Wt 221.0 lb

## 2016-07-28 DIAGNOSIS — IMO0002 Reserved for concepts with insufficient information to code with codable children: Secondary | ICD-10-CM

## 2016-07-28 DIAGNOSIS — Z8639 Personal history of other endocrine, nutritional and metabolic disease: Secondary | ICD-10-CM

## 2016-07-28 DIAGNOSIS — E119 Type 2 diabetes mellitus without complications: Secondary | ICD-10-CM

## 2016-07-28 DIAGNOSIS — N401 Enlarged prostate with lower urinary tract symptoms: Secondary | ICD-10-CM

## 2016-07-28 DIAGNOSIS — Z23 Encounter for immunization: Secondary | ICD-10-CM

## 2016-07-28 DIAGNOSIS — H4010X4 Unspecified open-angle glaucoma, indeterminate stage: Secondary | ICD-10-CM | POA: Diagnosis not present

## 2016-07-28 DIAGNOSIS — Q245 Malformation of coronary vessels: Secondary | ICD-10-CM | POA: Diagnosis not present

## 2016-07-28 DIAGNOSIS — Z0000 Encounter for general adult medical examination without abnormal findings: Secondary | ICD-10-CM

## 2016-07-28 DIAGNOSIS — I10 Essential (primary) hypertension: Secondary | ICD-10-CM | POA: Diagnosis not present

## 2016-07-28 DIAGNOSIS — E785 Hyperlipidemia, unspecified: Secondary | ICD-10-CM

## 2016-07-28 NOTE — Progress Notes (Signed)
Patient: Aaron Rivera, Male    DOB: 11-23-1929, 82 y.o.   MRN: [id number] Visit Date: 07/28/2016  Today's Provider: Megan Mansichard Gilbert Jr, MD   Chief Complaint  Patient presents with  . Medicare Wellness   Subjective:   Aaron Rivera is a 82 y.o. male who presents today for his Subsequent Annual Wellness Visit. He feels fairly well. He reports exercising no due to leg weakness at times. He reports he is sleeping well. He overall feels fairly well.Retired from driving a truck. His only complaint today is one of calf pain when he walks a certain distance. It resolves when he rests. He is taking his medications and knows that it is time for blood work and follow-up on his diabetes ,lipids all routine lab work. Immunization History  Administered Date(s) Administered  . Pneumococcal Conjugate-13 08/25/2014  . Pneumococcal Polysaccharide-23 09/11/2002  . Td 07/05/2007   Last colonoscopy was 11/26/02 diverticulosis, internal hemorrhoids Review of Systems  HENT: Negative.   Eyes: Negative.   Respiratory: Negative.   Cardiovascular: Negative.   Gastrointestinal: Negative.   Endocrine: Negative.   Genitourinary: Negative.   Musculoskeletal: Negative.        Some joint stiffness at times  Skin: Negative.   Allergic/Immunologic: Negative.   Neurological: Positive for weakness (leg feels like it gives out sometimes).  Hematological: Negative.   Psychiatric/Behavioral: Negative.     Patient Active Problem List   Diagnosis Date Noted  . AAA (abdominal aortic aneurysm) (HCC) 06/16/2015  . Allergic rhinitis 06/16/2015  . AI (aortic incompetence) 06/16/2015  . Benign prostatic hypertrophy with lower urinary tract symptoms (LUTS) 06/16/2015  . Coronary artery abnormality 06/16/2015  . ED (erectile dysfunction) of organic origin 06/16/2015  . Essential (primary) hypertension 06/16/2015  . Acid reflux 06/16/2015  . HLD (hyperlipidemia) 06/16/2015  . Arthritis, degenerative 06/16/2015  .  Allergic rhinitis, seasonal 06/16/2015  . Diabetes mellitus, type 2 (HCC) 06/16/2015    Social History   Social History  . Marital status: Married    Spouse name: N/A  . Number of children: N/A  . Years of education: N/A   Occupational History  . Not on file.   Social History Main Topics  . Smoking status: Former Smoker    Types: Cigarettes  . Smokeless tobacco: Former NeurosurgeonUser    Quit date: 11/20/1969  . Alcohol use No  . Drug use: No  . Sexual activity: No   Other Topics Concern  . Not on file   Social History Narrative  . No narrative on file    Past Surgical History:  Procedure Laterality Date  . ABDOMINAL AORTIC ANEURYSM REPAIR      His family history includes Breast cancer in his sister; Heart disease in his mother; Hypertension in his brother and brother; Prostate cancer in his brother.    Outpatient Medications Prior to Visit  Medication Sig Dispense Refill  . captopril (CAPOTEN) 25 MG tablet TAKE 1/2 TABLET BY MOUTH TWICE A DAY 90 tablet 3  . diltiazem (CARDIZEM CD) 300 MG 24 hr capsule TAKE 1 CAPSULE EVERY DAY 90 capsule 3  . fluticasone (FLONASE) 50 MCG/ACT nasal spray Place into the nose.    Marland Kitchen. glucose blood test strip 1 each by Other route 2 (two) times daily. Use as instructed 100 each 12  . hydrochlorothiazide (HYDRODIURIL) 25 MG tablet TAKE 1 TABLET EVERY DAY 90 tablet 3  . KLOR-CON 10 10 MEQ tablet TAKE 2 TABLETS EVERY DAY 90 tablet 3  . meclizine (ANTIVERT)  25 MG tablet Take by mouth.    Marland Kitchen omeprazole (PRILOSEC) 20 MG capsule TAKE 1 CAPSULE EVERY DAY 90 capsule 4  . pioglitazone (ACTOS) 15 MG tablet TAKE 1 TABLET BY MOUTH IN THE MORNING 30 tablet 11  . pravastatin (PRAVACHOL) 40 MG tablet TAKE 1 TABLET BY MOUTH AT BEDTIME 90 tablet 3  . ranitidine (ZANTAC) 150 MG tablet TAKE 1 TABLET BY MOUTH AT BEDTIME 30 tablet 12  . terazosin (HYTRIN) 2 MG capsule Take 1 capsule (2 mg total) by mouth daily. 90 capsule 3   No facility-administered medications prior  to visit.     No Known Allergies  Patient Care Team: Maple Hudson., MD as PCP - General (Family Medicine)  Objective:   Vitals:  Vitals:   07/28/16 0934  BP: 106/60  Pulse: 86  Resp: 14  Temp: 97.7 F (36.5 C)  Weight: 221 lb (100.2 kg)  Height: 5' 9.5" (1.765 m)    Physical Exam  Constitutional: He is oriented to person, place, and time. He appears well-developed and well-nourished.  HENT:  Head: Normocephalic and atraumatic.  Right Ear: External ear normal.  Left Ear: External ear normal.  Nose: Nose normal.  Mouth/Throat: Oropharynx is clear and moist.  Eyes: Conjunctivae are normal. Pupils are equal, round, and reactive to light.  Neck: Normal range of motion. Neck supple.  Cardiovascular: Normal rate, regular rhythm, normal heart sounds and intact distal pulses.   No murmur heard. Pulmonary/Chest: Effort normal and breath sounds normal. No respiratory distress. He has no wheezes.  Abdominal: Soft. He exhibits no distension. There is no tenderness. There is no rebound.  Musculoskeletal: Normal range of motion. He exhibits no edema or tenderness.  Neurological: He is alert and oriented to person, place, and time. No cranial nerve deficit. Coordination normal.  Skin: Skin is warm and dry. No rash noted. No erythema.  Right great toe onychomycosis.  Psychiatric: He has a normal mood and affect. His behavior is normal. Judgment and thought content normal.   Diabetic Foot Exam - Simple   Simple Foot Form Diabetic Foot exam was performed with the following findings:  Yes 07/28/2016 10:14 AM  Visual Inspection No deformities, no ulcerations, no other skin breakdown bilaterally:  Yes Sensation Testing Intact to touch and monofilament testing bilaterally:  Yes Pulse Check Posterior Tibialis and Dorsalis pulse intact bilaterally:  Yes Comments     Activities of Daily Living In your present state of health, do you have any difficulty performing the following  activities: 07/28/2016 02/23/2016  Hearing? Y N  Vision? N N  Difficulty concentrating or making decisions? N N  Walking or climbing stairs? Y Y  Dressing or bathing? N N  Doing errands, shopping? N N  Some recent data might be hidden    Fall Risk Assessment Fall Risk  07/28/2016 02/23/2016  Falls in the past year? No No     Depression Screen PHQ 2/9 Scores 07/28/2016 02/23/2016  PHQ - 2 Score 0 0    Cognitive Testing - 6-CIT    Year: 0 4 points  Month: 0 3 points  Memorize "Floyde Parkins, [address], [place]"  Time (within 1 hour:) 0 3 points  Count backwards from 20: 0 2 4 points  Name months of year: 0 2 4 points  Repeat Address: 0 2 4 6 8 10  points   Total Score: 10/28  Interpretation : Normal (0-7) Abnormal (8-28)    Assessment & Plan:     Annual  Wellness Visit  Reviewed patient's Family Medical History Reviewed and updated list of patient's medical providers Assessment of cognitive impairment was done Assessed patient's functional ability Established a written schedule for health screening services Health Risk Assessent Completed and Reviewed  1. Medicare annual wellness visit, subsequent Will check routine labs. 2. Essential (primary) hypertension Stable. Continue current medication.  3. Type 2 diabetes mellitus without complication, without long-term current use of insulin (HCC) Will check A1C and check urine microalbumin today. Has been stable  So far.  4. Benign prostatic hypertrophy with lower urinary tract symptoms (LUTS) Stable on medication.  5. Open-angle glaucoma of left eye, indeterminate stage, unspecified open-angle glaucoma type Follows ophthalmologist. Just had recent cataract surgery. Doing well.  6. Coronary artery abnormality No chest pain since MI at age 82. Risk factors treated. 7. Personal history of endocrine, metabolic or immunity disorder Will check labs. Work on habits. 8. Hyperlipidemia Treated on Pravachol 9. ED The patient  associates this with his advanced aging and I think he is correct. No intervention at this time. 10. PVD/ claudication Patient declines referral back to vascular. 11. Onychomycosis HPI, Exam and A&P transcribed under the direction and in the presence of Julieanne Manson, MD.

## 2016-07-29 LAB — CBC WITH DIFFERENTIAL/PLATELET
BASOS: 0 %
Basophils Absolute: 0 10*3/uL (ref 0.0–0.2)
EOS (ABSOLUTE): 0 10*3/uL (ref 0.0–0.4)
EOS: 0 %
HEMATOCRIT: 35.1 % — AB (ref 37.5–51.0)
HEMOGLOBIN: 11 g/dL — AB (ref 12.6–17.7)
IMMATURE GRANULOCYTES: 0 %
Immature Grans (Abs): 0 10*3/uL (ref 0.0–0.1)
LYMPHS ABS: 1.6 10*3/uL (ref 0.7–3.1)
Lymphs: 28 %
MCH: 29.6 pg (ref 26.6–33.0)
MCHC: 31.3 g/dL — AB (ref 31.5–35.7)
MCV: 94 fL (ref 79–97)
MONOS ABS: 1.6 10*3/uL — AB (ref 0.1–0.9)
Monocytes: 29 %
NEUTROS ABS: 2.4 10*3/uL (ref 1.4–7.0)
NEUTROS PCT: 43 %
Platelets: 120 10*3/uL — ABNORMAL LOW (ref 150–379)
RBC: 3.72 x10E6/uL — ABNORMAL LOW (ref 4.14–5.80)
RDW: 12.6 % (ref 12.3–15.4)
WBC: 5.6 10*3/uL (ref 3.4–10.8)

## 2016-07-29 LAB — COMPREHENSIVE METABOLIC PANEL
A/G RATIO: 1.4 (ref 1.2–2.2)
ALBUMIN: 4.3 g/dL (ref 3.5–4.7)
ALK PHOS: 88 IU/L (ref 39–117)
ALT: 15 IU/L (ref 0–44)
AST: 23 IU/L (ref 0–40)
BUN / CREAT RATIO: 13 (ref 10–24)
BUN: 20 mg/dL (ref 8–27)
Bilirubin Total: 0.5 mg/dL (ref 0.0–1.2)
CO2: 24 mmol/L (ref 18–29)
CREATININE: 1.57 mg/dL — AB (ref 0.76–1.27)
Calcium: 9.4 mg/dL (ref 8.6–10.2)
Chloride: 100 mmol/L (ref 96–106)
GFR calc Af Amer: 46 mL/min/{1.73_m2} — ABNORMAL LOW (ref >59)
GFR, EST NON AFRICAN AMERICAN: 40 mL/min/{1.73_m2} — AB (ref >59)
GLOBULIN, TOTAL: 3.1 g/dL (ref 1.5–4.5)
Glucose: 163 mg/dL — ABNORMAL HIGH (ref 65–99)
POTASSIUM: 3.6 mmol/L (ref 3.5–5.2)
SODIUM: 141 mmol/L (ref 134–144)
Total Protein: 7.4 g/dL (ref 6.0–8.5)

## 2016-07-29 LAB — LIPID PANEL WITH LDL/HDL RATIO
Cholesterol, Total: 173 mg/dL (ref 100–199)
HDL: 31 mg/dL — AB (ref >39)
LDL CALC: 121 mg/dL — AB (ref 0–99)
LDL/HDL RATIO: 3.9 ratio — AB (ref 0.0–3.6)
TRIGLYCERIDES: 107 mg/dL (ref 0–149)
VLDL Cholesterol Cal: 21 mg/dL (ref 5–40)

## 2016-07-29 LAB — HEMOGLOBIN A1C
Est. average glucose Bld gHb Est-mCnc: 143 mg/dL
Hgb A1c MFr Bld: 6.6 % — ABNORMAL HIGH (ref 4.8–5.6)

## 2016-07-29 LAB — TSH: TSH: 2.68 u[IU]/mL (ref 0.450–4.500)

## 2016-07-30 ENCOUNTER — Telehealth: Payer: Self-pay

## 2016-07-30 NOTE — Telephone Encounter (Signed)
No answer  Thanks,  -Jannely Henthorn 

## 2016-07-30 NOTE — Telephone Encounter (Signed)
-----   Message from Maple Hudsonichard L Gilbert Jr., MD sent at 07/30/2016  8:16 AM EDT ----- Labs stable.

## 2016-08-01 ENCOUNTER — Other Ambulatory Visit: Payer: Self-pay

## 2016-08-01 MED ORDER — PIOGLITAZONE HCL 15 MG PO TABS: 15.0000 mg | ORAL_TABLET | Freq: Every morning | ORAL | 3 refills | Status: DC

## 2016-08-01 MED ORDER — TERAZOSIN HCL 2 MG PO CAPS: 2.0000 mg | ORAL_CAPSULE | Freq: Every day | ORAL | 3 refills | Status: DC

## 2016-08-01 NOTE — Telephone Encounter (Signed)
Pt advised-aa 

## 2016-09-22 ENCOUNTER — Other Ambulatory Visit: Payer: Self-pay | Admitting: Family Medicine

## 2016-10-03 DIAGNOSIS — E785 Hyperlipidemia, unspecified: Secondary | ICD-10-CM | POA: Diagnosis not present

## 2016-10-03 DIAGNOSIS — Z6829 Body mass index (BMI) 29.0-29.9, adult: Secondary | ICD-10-CM | POA: Diagnosis not present

## 2016-10-03 DIAGNOSIS — E119 Type 2 diabetes mellitus without complications: Secondary | ICD-10-CM | POA: Diagnosis not present

## 2016-10-03 DIAGNOSIS — K219 Gastro-esophageal reflux disease without esophagitis: Secondary | ICD-10-CM | POA: Diagnosis not present

## 2016-10-03 DIAGNOSIS — Z0000 Encounter for general adult medical examination without abnormal findings: Secondary | ICD-10-CM | POA: Diagnosis not present

## 2016-10-03 DIAGNOSIS — I10 Essential (primary) hypertension: Secondary | ICD-10-CM | POA: Diagnosis not present

## 2016-10-20 ENCOUNTER — Other Ambulatory Visit: Payer: Self-pay | Admitting: Family Medicine

## 2016-10-26 DIAGNOSIS — R69 Illness, unspecified: Secondary | ICD-10-CM | POA: Diagnosis not present

## 2016-11-16 DIAGNOSIS — Z0100 Encounter for examination of eyes and vision without abnormal findings: Secondary | ICD-10-CM | POA: Diagnosis not present

## 2016-11-16 DIAGNOSIS — H2510 Age-related nuclear cataract, unspecified eye: Secondary | ICD-10-CM | POA: Diagnosis not present

## 2016-11-16 DIAGNOSIS — E7800 Pure hypercholesterolemia, unspecified: Secondary | ICD-10-CM | POA: Diagnosis not present

## 2016-11-16 DIAGNOSIS — E119 Type 2 diabetes mellitus without complications: Secondary | ICD-10-CM | POA: Diagnosis not present

## 2016-11-16 DIAGNOSIS — I10 Essential (primary) hypertension: Secondary | ICD-10-CM | POA: Diagnosis not present

## 2016-11-17 DIAGNOSIS — H5213 Myopia, bilateral: Secondary | ICD-10-CM | POA: Diagnosis not present

## 2016-11-17 DIAGNOSIS — Z0100 Encounter for examination of eyes and vision without abnormal findings: Secondary | ICD-10-CM | POA: Diagnosis not present

## 2016-11-17 DIAGNOSIS — H524 Presbyopia: Secondary | ICD-10-CM | POA: Diagnosis not present

## 2016-11-17 DIAGNOSIS — H52202 Unspecified astigmatism, left eye: Secondary | ICD-10-CM | POA: Diagnosis not present

## 2016-11-21 HISTORY — PX: OTHER SURGICAL HISTORY: SHX169

## 2016-11-22 DIAGNOSIS — Z0100 Encounter for examination of eyes and vision without abnormal findings: Secondary | ICD-10-CM | POA: Diagnosis not present

## 2016-12-12 ENCOUNTER — Ambulatory Visit (INDEPENDENT_AMBULATORY_CARE_PROVIDER_SITE_OTHER): Payer: Medicare HMO | Admitting: Family Medicine

## 2016-12-12 VITALS — BP 108/60 | HR 68 | Temp 97.4°F | Resp 16 | Wt 218.0 lb

## 2016-12-12 DIAGNOSIS — E119 Type 2 diabetes mellitus without complications: Secondary | ICD-10-CM | POA: Diagnosis not present

## 2016-12-12 DIAGNOSIS — E785 Hyperlipidemia, unspecified: Secondary | ICD-10-CM | POA: Diagnosis not present

## 2016-12-12 MED ORDER — ROSUVASTATIN CALCIUM 20 MG PO TABS: 20.0000 mg | ORAL_TABLET | Freq: Every day | ORAL | 3 refills | Status: AC

## 2016-12-12 NOTE — Progress Notes (Signed)
Aaron Rivera  MRN: [id number] DOB: [birth year]  Subjective:  HPI   The patient is an 83 year old male who presents today for follow up of his diabetes.  He was last seen on 07/28/16 and his A1C at that time was 6.6.  He reports the he is checking his glucose at home.  His home readings have been in the range of 135-140. He denies having any episode suggestive of hypoglycemia.   He has not had a diabetic eye exam or urine microalbumin in the last year.  His last diabetic foot exam was on his last visit.  Patient Active Problem List   Diagnosis Date Noted  . AAA (abdominal aortic aneurysm) (HCC) 06/16/2015  . Allergic rhinitis 06/16/2015  . AI (aortic incompetence) 06/16/2015  . Benign prostatic hypertrophy with lower urinary tract symptoms (LUTS) 06/16/2015  . Coronary artery abnormality 06/16/2015  . ED (erectile dysfunction) of organic origin 06/16/2015  . Essential (primary) hypertension 06/16/2015  . Acid reflux 06/16/2015  . HLD (hyperlipidemia) 06/16/2015  . Arthritis, degenerative 06/16/2015  . Allergic rhinitis, seasonal 06/16/2015  . Diabetes mellitus, type 2 (HCC) 06/16/2015    No past medical history on file.  Social History   Social History  . Marital status: Married    Spouse name: N/A  . Number of children: N/A  . Years of education: N/A   Occupational History  . Not on file.   Social History Main Topics  . Smoking status: Former Smoker    Types: Cigarettes  . Smokeless tobacco: Former NeurosurgeonUser    Quit date: 11/20/1969  . Alcohol use No  . Drug use: No  . Sexual activity: No   Other Topics Concern  . Not on file   Social History Narrative  . No narrative on file    Outpatient Encounter Prescriptions as of 12/12/2016  Medication Sig Note  . captopril (CAPOTEN) 25 MG tablet TAKE 1/2 TABLET BY MOUTH TWICE A DAY   . diltiazem (CARDIZEM CD) 300 MG 24 hr capsule TAKE 1 CAPSULE EVERY DAY   . fluticasone (FLONASE) 50 MCG/ACT nasal spray Place into the  nose. 06/16/2015: [facility]: [facility]  . glucose blood test strip 1 each by Other route 2 (two) times daily. Use as instructed   . hydrochlorothiazide (HYDRODIURIL) 25 MG tablet TAKE 1 TABLET EVERY DAY   . KLOR-CON 10 10 MEQ tablet TAKE 2 TABLETS EVERY DAY   . meclizine (ANTIVERT) 25 MG tablet Take by mouth. 06/16/2015: Received from: [facility]  . omeprazole (PRILOSEC) 20 MG capsule TAKE 1 CAPSULE EVERY DAY   . pioglitazone (ACTOS) 15 MG tablet Take 1 tablet (15 mg total) by mouth every morning.   . pravastatin (PRAVACHOL) 40 MG tablet TAKE 1 TABLET BY MOUTH AT BEDTIME   . ranitidine (ZANTAC) 150 MG tablet TAKE 1 TABLET BY MOUTH AT BEDTIME   . terazosin (HYTRIN) 2 MG capsule Take 1 capsule (2 mg total) by mouth daily.    No facility-administered encounter medications on file as of 12/12/2016.     No Known Allergies  Review of Systems  Constitutional: Negative for fever and malaise/fatigue.  Eyes: Negative.   Respiratory: Negative for cough, shortness of breath and wheezing.   Cardiovascular: Negative for chest pain, palpitations, orthopnea, claudication, leg swelling and PND.  Gastrointestinal: Negative.   Genitourinary: Negative for dysuria.  Skin: Negative.   Neurological: Negative for dizziness, weakness and headaches.  Endo/Heme/Allergies: Negative.  Negative for polydipsia.  Psychiatric/Behavioral: Negative.  Objective:  BP 108/60 (BP Location: Right Arm, Patient Position: Sitting, Cuff Size: Normal)   Pulse 68   Temp 97.4 F (36.3 C) (Oral)   Resp 16   Wt 218 lb (98.9 kg)   BMI 31.73 kg/m   Physical Exam  Constitutional: He is oriented to person, place, and time and well-developed, well-nourished, and in no distress.  HENT:  Head: Normocephalic and atraumatic.  Right Ear: External ear normal.  Left Ear: External ear normal.  Nose: Nose normal.  Eyes: Conjunctivae are normal. Pupils are equal, round, and reactive to light.   Neck: Normal range of motion. No thyromegaly present.  Cardiovascular: Normal rate, regular rhythm and normal heart sounds.   Pulmonary/Chest: Effort normal and breath sounds normal.  Abdominal: Soft.  Neurological: He is alert and oriented to person, place, and time. Gait normal. GCS score is 15.  Skin: Skin is warm and dry.  Psychiatric: Mood, memory, affect and judgment normal.    Assessment and Plan :   1. Type 2 diabetes mellitus without complication, without long-term current use of insulin (HCC) A1C is down from 6.6 to 6.3.  Will follow up in 4 months with A1C  - Hemoglobin A1c - rosuvastatin (CRESTOR) 20 MG tablet; Take 1 tablet (20 mg total) by mouth daily.  Dispense: 90 tablet; Refill: 3  2. Hyperlipidemia, unspecified hyperlipidemia type Will change from Pravastatin to Rosuvastatin for better control. Follow up in  4 months an d will recheck lipids at that time.  - rosuvastatin (CRESTOR) 20 MG tablet; Take 1 tablet (20 mg total) by mouth daily.  Dispense: 90 tablet; Refill: 3 3. CAD/MI in 1989 All risk factors treated. HPI, Exam and A&P Transcribed under the direction and in the presence of Julieanne Manson, Montez Hageman., MD. Electronically Signed: Janey Greaser, RMA I have done the exam and reviewed the chart and it is accurate to the best of my knowledge. Dentist has been used and  any errors in dictation or transcription are unintentional. Julieanne Manson M.D. [name]

## 2016-12-28 ENCOUNTER — Other Ambulatory Visit: Payer: Self-pay | Admitting: Family Medicine

## 2016-12-28 DIAGNOSIS — I10 Essential (primary) hypertension: Secondary | ICD-10-CM

## 2017-01-09 ENCOUNTER — Other Ambulatory Visit: Payer: Self-pay | Admitting: Family Medicine

## 2017-01-15 ENCOUNTER — Other Ambulatory Visit: Payer: Self-pay | Admitting: Family Medicine

## 2017-01-15 DIAGNOSIS — I10 Essential (primary) hypertension: Secondary | ICD-10-CM

## 2017-02-21 DIAGNOSIS — H18413 Arcus senilis, bilateral: Secondary | ICD-10-CM | POA: Diagnosis not present

## 2017-02-21 DIAGNOSIS — Z961 Presence of intraocular lens: Secondary | ICD-10-CM | POA: Diagnosis not present

## 2017-02-21 DIAGNOSIS — H04123 Dry eye syndrome of bilateral lacrimal glands: Secondary | ICD-10-CM | POA: Diagnosis not present

## 2017-02-21 DIAGNOSIS — H1045 Other chronic allergic conjunctivitis: Secondary | ICD-10-CM | POA: Diagnosis not present

## 2017-02-24 ENCOUNTER — Other Ambulatory Visit: Payer: Self-pay | Admitting: Family Medicine

## 2017-02-24 DIAGNOSIS — E119 Type 2 diabetes mellitus without complications: Secondary | ICD-10-CM

## 2017-02-27 DIAGNOSIS — R69 Illness, unspecified: Secondary | ICD-10-CM | POA: Diagnosis not present

## 2017-03-03 DIAGNOSIS — R202 Paresthesia of skin: Secondary | ICD-10-CM | POA: Diagnosis not present

## 2017-03-03 DIAGNOSIS — M9904 Segmental and somatic dysfunction of sacral region: Secondary | ICD-10-CM | POA: Diagnosis not present

## 2017-03-03 DIAGNOSIS — M5417 Radiculopathy, lumbosacral region: Secondary | ICD-10-CM | POA: Diagnosis not present

## 2017-03-03 DIAGNOSIS — M5137 Other intervertebral disc degeneration, lumbosacral region: Secondary | ICD-10-CM | POA: Diagnosis not present

## 2017-03-03 DIAGNOSIS — M9903 Segmental and somatic dysfunction of lumbar region: Secondary | ICD-10-CM | POA: Diagnosis not present

## 2017-03-03 DIAGNOSIS — M5136 Other intervertebral disc degeneration, lumbar region: Secondary | ICD-10-CM | POA: Diagnosis not present

## 2017-03-03 DIAGNOSIS — M791 Myalgia: Secondary | ICD-10-CM | POA: Diagnosis not present

## 2017-03-03 DIAGNOSIS — M5416 Radiculopathy, lumbar region: Secondary | ICD-10-CM | POA: Diagnosis not present

## 2017-03-03 DIAGNOSIS — M545 Low back pain: Secondary | ICD-10-CM | POA: Diagnosis not present

## 2017-03-07 DIAGNOSIS — M9904 Segmental and somatic dysfunction of sacral region: Secondary | ICD-10-CM | POA: Diagnosis not present

## 2017-03-07 DIAGNOSIS — M545 Low back pain: Secondary | ICD-10-CM | POA: Diagnosis not present

## 2017-03-07 DIAGNOSIS — M5137 Other intervertebral disc degeneration, lumbosacral region: Secondary | ICD-10-CM | POA: Diagnosis not present

## 2017-03-07 DIAGNOSIS — R202 Paresthesia of skin: Secondary | ICD-10-CM | POA: Diagnosis not present

## 2017-03-07 DIAGNOSIS — M5136 Other intervertebral disc degeneration, lumbar region: Secondary | ICD-10-CM | POA: Diagnosis not present

## 2017-03-07 DIAGNOSIS — M5416 Radiculopathy, lumbar region: Secondary | ICD-10-CM | POA: Diagnosis not present

## 2017-03-07 DIAGNOSIS — M5417 Radiculopathy, lumbosacral region: Secondary | ICD-10-CM | POA: Diagnosis not present

## 2017-03-07 DIAGNOSIS — M791 Myalgia: Secondary | ICD-10-CM | POA: Diagnosis not present

## 2017-03-07 DIAGNOSIS — M9903 Segmental and somatic dysfunction of lumbar region: Secondary | ICD-10-CM | POA: Diagnosis not present

## 2017-03-08 DIAGNOSIS — M791 Myalgia: Secondary | ICD-10-CM | POA: Diagnosis not present

## 2017-03-08 DIAGNOSIS — M5416 Radiculopathy, lumbar region: Secondary | ICD-10-CM | POA: Diagnosis not present

## 2017-03-08 DIAGNOSIS — R202 Paresthesia of skin: Secondary | ICD-10-CM | POA: Diagnosis not present

## 2017-03-08 DIAGNOSIS — M5136 Other intervertebral disc degeneration, lumbar region: Secondary | ICD-10-CM | POA: Diagnosis not present

## 2017-03-08 DIAGNOSIS — M545 Low back pain: Secondary | ICD-10-CM | POA: Diagnosis not present

## 2017-03-08 DIAGNOSIS — M5417 Radiculopathy, lumbosacral region: Secondary | ICD-10-CM | POA: Diagnosis not present

## 2017-03-08 DIAGNOSIS — M9903 Segmental and somatic dysfunction of lumbar region: Secondary | ICD-10-CM | POA: Diagnosis not present

## 2017-03-08 DIAGNOSIS — M9904 Segmental and somatic dysfunction of sacral region: Secondary | ICD-10-CM | POA: Diagnosis not present

## 2017-03-08 DIAGNOSIS — M5137 Other intervertebral disc degeneration, lumbosacral region: Secondary | ICD-10-CM | POA: Diagnosis not present

## 2017-03-09 DIAGNOSIS — M5137 Other intervertebral disc degeneration, lumbosacral region: Secondary | ICD-10-CM | POA: Diagnosis not present

## 2017-03-09 DIAGNOSIS — M9903 Segmental and somatic dysfunction of lumbar region: Secondary | ICD-10-CM | POA: Diagnosis not present

## 2017-03-09 DIAGNOSIS — M791 Myalgia: Secondary | ICD-10-CM | POA: Diagnosis not present

## 2017-03-09 DIAGNOSIS — M5416 Radiculopathy, lumbar region: Secondary | ICD-10-CM | POA: Diagnosis not present

## 2017-03-09 DIAGNOSIS — M5136 Other intervertebral disc degeneration, lumbar region: Secondary | ICD-10-CM | POA: Diagnosis not present

## 2017-03-09 DIAGNOSIS — R202 Paresthesia of skin: Secondary | ICD-10-CM | POA: Diagnosis not present

## 2017-03-09 DIAGNOSIS — M9904 Segmental and somatic dysfunction of sacral region: Secondary | ICD-10-CM | POA: Diagnosis not present

## 2017-03-09 DIAGNOSIS — M5417 Radiculopathy, lumbosacral region: Secondary | ICD-10-CM | POA: Diagnosis not present

## 2017-03-09 DIAGNOSIS — M545 Low back pain: Secondary | ICD-10-CM | POA: Diagnosis not present

## 2017-03-14 DIAGNOSIS — M5137 Other intervertebral disc degeneration, lumbosacral region: Secondary | ICD-10-CM | POA: Diagnosis not present

## 2017-03-14 DIAGNOSIS — M9903 Segmental and somatic dysfunction of lumbar region: Secondary | ICD-10-CM | POA: Diagnosis not present

## 2017-03-14 DIAGNOSIS — M5136 Other intervertebral disc degeneration, lumbar region: Secondary | ICD-10-CM | POA: Diagnosis not present

## 2017-03-14 DIAGNOSIS — M791 Myalgia: Secondary | ICD-10-CM | POA: Diagnosis not present

## 2017-03-14 DIAGNOSIS — M5417 Radiculopathy, lumbosacral region: Secondary | ICD-10-CM | POA: Diagnosis not present

## 2017-03-14 DIAGNOSIS — M545 Low back pain: Secondary | ICD-10-CM | POA: Diagnosis not present

## 2017-03-14 DIAGNOSIS — R202 Paresthesia of skin: Secondary | ICD-10-CM | POA: Diagnosis not present

## 2017-03-14 DIAGNOSIS — M5416 Radiculopathy, lumbar region: Secondary | ICD-10-CM | POA: Diagnosis not present

## 2017-03-14 DIAGNOSIS — M9904 Segmental and somatic dysfunction of sacral region: Secondary | ICD-10-CM | POA: Diagnosis not present

## 2017-03-15 DIAGNOSIS — M5136 Other intervertebral disc degeneration, lumbar region: Secondary | ICD-10-CM | POA: Diagnosis not present

## 2017-03-15 DIAGNOSIS — M5417 Radiculopathy, lumbosacral region: Secondary | ICD-10-CM | POA: Diagnosis not present

## 2017-03-15 DIAGNOSIS — M9903 Segmental and somatic dysfunction of lumbar region: Secondary | ICD-10-CM | POA: Diagnosis not present

## 2017-03-15 DIAGNOSIS — M5137 Other intervertebral disc degeneration, lumbosacral region: Secondary | ICD-10-CM | POA: Diagnosis not present

## 2017-03-15 DIAGNOSIS — R202 Paresthesia of skin: Secondary | ICD-10-CM | POA: Diagnosis not present

## 2017-03-15 DIAGNOSIS — M545 Low back pain: Secondary | ICD-10-CM | POA: Diagnosis not present

## 2017-03-15 DIAGNOSIS — M5416 Radiculopathy, lumbar region: Secondary | ICD-10-CM | POA: Diagnosis not present

## 2017-03-15 DIAGNOSIS — M791 Myalgia: Secondary | ICD-10-CM | POA: Diagnosis not present

## 2017-03-15 DIAGNOSIS — M9904 Segmental and somatic dysfunction of sacral region: Secondary | ICD-10-CM | POA: Diagnosis not present

## 2017-03-16 DIAGNOSIS — M5136 Other intervertebral disc degeneration, lumbar region: Secondary | ICD-10-CM | POA: Diagnosis not present

## 2017-03-16 DIAGNOSIS — M5137 Other intervertebral disc degeneration, lumbosacral region: Secondary | ICD-10-CM | POA: Diagnosis not present

## 2017-03-16 DIAGNOSIS — M9903 Segmental and somatic dysfunction of lumbar region: Secondary | ICD-10-CM | POA: Diagnosis not present

## 2017-03-16 DIAGNOSIS — M5416 Radiculopathy, lumbar region: Secondary | ICD-10-CM | POA: Diagnosis not present

## 2017-03-16 DIAGNOSIS — M5417 Radiculopathy, lumbosacral region: Secondary | ICD-10-CM | POA: Diagnosis not present

## 2017-03-16 DIAGNOSIS — M791 Myalgia: Secondary | ICD-10-CM | POA: Diagnosis not present

## 2017-03-16 DIAGNOSIS — R202 Paresthesia of skin: Secondary | ICD-10-CM | POA: Diagnosis not present

## 2017-03-16 DIAGNOSIS — M9904 Segmental and somatic dysfunction of sacral region: Secondary | ICD-10-CM | POA: Diagnosis not present

## 2017-03-16 DIAGNOSIS — M545 Low back pain: Secondary | ICD-10-CM | POA: Diagnosis not present

## 2017-03-17 ENCOUNTER — Other Ambulatory Visit (INDEPENDENT_AMBULATORY_CARE_PROVIDER_SITE_OTHER): Payer: Self-pay | Admitting: Vascular Surgery

## 2017-03-17 DIAGNOSIS — I70213 Atherosclerosis of native arteries of extremities with intermittent claudication, bilateral legs: Secondary | ICD-10-CM

## 2017-03-17 DIAGNOSIS — I714 Abdominal aortic aneurysm, without rupture, unspecified: Secondary | ICD-10-CM

## 2017-03-17 DIAGNOSIS — I6523 Occlusion and stenosis of bilateral carotid arteries: Secondary | ICD-10-CM

## 2017-03-17 DIAGNOSIS — Z959 Presence of cardiac and vascular implant and graft, unspecified: Secondary | ICD-10-CM

## 2017-03-20 ENCOUNTER — Ambulatory Visit (INDEPENDENT_AMBULATORY_CARE_PROVIDER_SITE_OTHER): Payer: Medicare HMO

## 2017-03-20 ENCOUNTER — Ambulatory Visit (INDEPENDENT_AMBULATORY_CARE_PROVIDER_SITE_OTHER): Payer: Medicare HMO | Admitting: Vascular Surgery

## 2017-03-20 ENCOUNTER — Encounter (INDEPENDENT_AMBULATORY_CARE_PROVIDER_SITE_OTHER): Payer: Self-pay | Admitting: Vascular Surgery

## 2017-03-20 VITALS — BP 158/72 | HR 63 | Resp 16 | Ht 72.0 in | Wt 215.0 lb

## 2017-03-20 DIAGNOSIS — I6529 Occlusion and stenosis of unspecified carotid artery: Secondary | ICD-10-CM | POA: Insufficient documentation

## 2017-03-20 DIAGNOSIS — Z959 Presence of cardiac and vascular implant and graft, unspecified: Secondary | ICD-10-CM

## 2017-03-20 DIAGNOSIS — I70213 Atherosclerosis of native arteries of extremities with intermittent claudication, bilateral legs: Secondary | ICD-10-CM | POA: Diagnosis not present

## 2017-03-20 DIAGNOSIS — E119 Type 2 diabetes mellitus without complications: Secondary | ICD-10-CM

## 2017-03-20 DIAGNOSIS — I714 Abdominal aortic aneurysm, without rupture, unspecified: Secondary | ICD-10-CM

## 2017-03-20 DIAGNOSIS — I6523 Occlusion and stenosis of bilateral carotid arteries: Secondary | ICD-10-CM

## 2017-03-20 DIAGNOSIS — I739 Peripheral vascular disease, unspecified: Secondary | ICD-10-CM | POA: Diagnosis not present

## 2017-03-20 DIAGNOSIS — Q245 Malformation of coronary vessels: Secondary | ICD-10-CM

## 2017-03-20 DIAGNOSIS — E782 Mixed hyperlipidemia: Secondary | ICD-10-CM

## 2017-03-20 DIAGNOSIS — I10 Essential (primary) hypertension: Secondary | ICD-10-CM

## 2017-03-20 NOTE — Progress Notes (Signed)
MRN : [id number]  Aaron Rivera is a 83 y.o. ([birth year]) male who presents with chief complaint of EVAR follow up.  History of Present Illness: The patient returns to the office for surveillance of an abdominal aortic aneurysm status post stent graft placement on 10/02/2013.   Patient denies abdominal pain or back pain, no other abdominal complaints. No groin related complaints. No symptoms consistent with distal embolization No changes in claudication distance.   There have been no interval changes in his overall healthcare since his last visit.   Patient denies amaurosis fugax or TIA symptoms. There is no history of claudication or rest pain symptoms of the lower extremities. The patient denies angina or shortness of breath.   Duplex US of the aorta and iliac arteries shows a 6.4 AAA sac with no endoleak, no change in the sac compared to the previous study.  No outpatient prescriptions have been marked as taking for the 03/20/17 encounter (Appointment) with Renford Dills, MD.    No past medical history on file.  Past Surgical History:  Procedure Laterality Date  . ABDOMINAL AORTIC ANEURYSM REPAIR      Social History Social History  Substance Use Topics  . Smoking status: Former Smoker    Types: Cigarettes  . Smokeless tobacco: Former Neurosurgeon    Quit date: 11/20/1969  . Alcohol use No    Family History Family History  Problem Relation Age of Onset  . Heart disease Mother   . Breast cancer Sister   . Prostate cancer Brother   . Hypertension Brother   . Hypertension Brother     No Known Allergies   REVIEW OF SYSTEMS (Negative unless checked)  Constitutional: Weight loss  Fever  Chills Cardiac: Chest pain   Chest pressure   Palpitations   Shortness of breath when laying flat   Shortness of breath with exertion. Vascular:  Pain in legs with walking   Pain in legs at rest  History of DVT   Phlebitis   Swelling in legs   Varicose  veins   Non-healing ulcers Pulmonary:   Uses home oxygen   Productive cough   Hemoptysis   Wheeze  COPD   Asthma Neurologic:  Dizziness   Seizures   History of stroke   History of TIA  Aphasia   Vissual changes   Weakness or numbness in arm   Weakness or numbness in leg Musculoskeletal:   Joint swelling   Joint pain   Low back pain Hematologic:  Easy bruising  Easy bleeding   Hypercoagulable state   Anemic Gastrointestinal:  Diarrhea   Vomiting  Gastroesophageal reflux/heartburn   Difficulty swallowing. Genitourinary:  Chronic kidney disease   Difficult urination  Frequent urination   Blood in urine Skin:  Rashes   Ulcers  Psychological:  History of anxiety    History of major depression.  Physical Examination  There were no vitals filed for this visit. There is no height or weight on file to calculate BMI. Gen: WD/WN, NAD Head: [place]/AT, No temporalis wasting.  Ear/Nose/Throat: Hearing grossly intact, nares w/o erythema or drainage Eyes: PER, EOMI, sclera nonicteric.  Neck: Supple, no large masses.   Pulmonary:  Good air movement, no audible wheezing bilaterally, no use of accessory muscles.  Cardiac: RRR, no JVD Vascular:  Vessel Right Left  Radial Palpable Palpable  Ulnar Palpable Palpable  Brachial Palpable Palpable  Carotid Palpable Palpable  Femoral Palpable Palpable  Popliteal Palpable Palpable  PT Palpable Palpable  DP  Palpable Palpable  Gastrointestinal: Non-distended. No guarding/no peritoneal signs.  Musculoskeletal: M/S 5/5 throughout.  No deformity or atrophy.  Neurologic: CN 2-12 intact. Symmetrical.  Speech is fluent. Motor exam as listed above. Psychiatric: Judgment intact, Mood & affect appropriate for pt's clinical situation. Dermatologic: No rashes or ulcers noted.  No changes consistent with cellulitis. Lymph : No lichenification or skin changes of chronic lymphedema.  CBC Lab Results    Component Value Date   WBC 5.6 07/28/2016   HGB 11.8 (A) 06/16/2014   HCT 35.1 (L) 07/28/2016   MCV 94 07/28/2016   PLT 120 (L) 07/28/2016    BMET    Component Value Date/Time   NA 141 07/28/2016 1042   NA 139 10/03/2013 0411   K 3.6 07/28/2016 1042   K 3.8 10/03/2013 0411   CL 100 07/28/2016 1042   CL 105 10/03/2013 0411   CO2 24 07/28/2016 1042   CO2 27 10/03/2013 0411   GLUCOSE 163 (H) 07/28/2016 1042   GLUCOSE 229 (H) 10/03/2013 0411   BUN 20 07/28/2016 1042   BUN 9 10/03/2013 0411   CREATININE 1.57 (H) 07/28/2016 1042   CREATININE 1.25 10/03/2013 0411   CALCIUM 9.4 07/28/2016 1042   CALCIUM 8.3 (L) 10/03/2013 0411   GFRNONAA 40 (L) 07/28/2016 1042   GFRNONAA 54 (L) 10/03/2013 0411   GFRAA 46 (L) 07/28/2016 1042   GFRAA >60 10/03/2013 0411   CrCl cannot be calculated (Patient's most recent lab result is older than the maximum 21 days allowed.).  COAG Lab Results  Component Value Date   INR 1.1 10/03/2013    Radiology No results found.  Assessment/Plan 1. Abdominal aortic aneurysm (AAA) without rupture (HCC) Recommend: Patient is status post successful endovascular repair of the AAA.   No further intervention is required at this time.   No endoleak is detected and the aneurysm sac is stable.  The patient will continue antiplatelet therapy as prescribed as well as aggressive management of hyperlipidemia. Exercise is again strongly encouraged.   However, endografts require continued surveillance with ultrasound or CT scan. This is mandatory to detect any changes that allow repressurization of the aneurysm sac.  The patient is informed that this would be asymptomatic.  The patient is reminded that lifelong routine surveillance is a necessity with an endograft. Patient will continue to follow-up at 6 month intervals with ultrasound of the aorta.  2. PAD (peripheral artery disease) (HCC)  Recommend:  The patient has evidence of atherosclerosis of the lower  extremities with claudication.  The patient does not voice lifestyle limiting changes at this point in time.  Noninvasive studies do not suggest clinically significant change.  No invasive studies, angiography or surgery at this time The patient should continue walking and begin a more formal exercise program.  The patient should continue antiplatelet therapy and aggressive treatment of the lipid abnormalities  No changes in the patient's medications at this time  The patient should continue wearing graduated compression socks 10-15 mmHg strength to control the mild edema.    3. Coronary artery abnormality Continue cardiac and antihypertensive medications as already ordered and reviewed, no changes at this time.  Continue statin as ordered and reviewed, no changes at this time  Nitrates PRN for chest pain   4. Essential (primary) hypertension Continue antihypertensive medications as already ordered, these medications have been reviewed and there are no changes at this time.   5. Type 2 diabetes mellitus without complication, without long-term current use of insulin (HCC)  Continue hypoglycemic medications as already ordered, these medications have been reviewed and there are no changes at this time.  Hgb A1C to be monitored as already arranged by primary service  6. Mixed hyperlipidemia Continue statin as ordered and reviewed, no changes at this time     Levora Dredge, MD  03/20/2017 8:39 AM

## 2017-03-21 DIAGNOSIS — R202 Paresthesia of skin: Secondary | ICD-10-CM | POA: Diagnosis not present

## 2017-03-21 DIAGNOSIS — M9903 Segmental and somatic dysfunction of lumbar region: Secondary | ICD-10-CM | POA: Diagnosis not present

## 2017-03-21 DIAGNOSIS — M545 Low back pain: Secondary | ICD-10-CM | POA: Diagnosis not present

## 2017-03-21 DIAGNOSIS — M5417 Radiculopathy, lumbosacral region: Secondary | ICD-10-CM | POA: Diagnosis not present

## 2017-03-21 DIAGNOSIS — M5136 Other intervertebral disc degeneration, lumbar region: Secondary | ICD-10-CM | POA: Diagnosis not present

## 2017-03-21 DIAGNOSIS — M791 Myalgia: Secondary | ICD-10-CM | POA: Diagnosis not present

## 2017-03-21 DIAGNOSIS — M5416 Radiculopathy, lumbar region: Secondary | ICD-10-CM | POA: Diagnosis not present

## 2017-03-21 DIAGNOSIS — M9904 Segmental and somatic dysfunction of sacral region: Secondary | ICD-10-CM | POA: Diagnosis not present

## 2017-03-21 DIAGNOSIS — M5137 Other intervertebral disc degeneration, lumbosacral region: Secondary | ICD-10-CM | POA: Diagnosis not present

## 2017-03-22 DIAGNOSIS — M791 Myalgia: Secondary | ICD-10-CM | POA: Diagnosis not present

## 2017-03-22 DIAGNOSIS — M545 Low back pain: Secondary | ICD-10-CM | POA: Diagnosis not present

## 2017-03-22 DIAGNOSIS — M5136 Other intervertebral disc degeneration, lumbar region: Secondary | ICD-10-CM | POA: Diagnosis not present

## 2017-03-22 DIAGNOSIS — M9903 Segmental and somatic dysfunction of lumbar region: Secondary | ICD-10-CM | POA: Diagnosis not present

## 2017-03-22 DIAGNOSIS — R202 Paresthesia of skin: Secondary | ICD-10-CM | POA: Diagnosis not present

## 2017-03-22 DIAGNOSIS — M5416 Radiculopathy, lumbar region: Secondary | ICD-10-CM | POA: Diagnosis not present

## 2017-03-22 DIAGNOSIS — M5137 Other intervertebral disc degeneration, lumbosacral region: Secondary | ICD-10-CM | POA: Diagnosis not present

## 2017-03-22 DIAGNOSIS — M5417 Radiculopathy, lumbosacral region: Secondary | ICD-10-CM | POA: Diagnosis not present

## 2017-03-22 DIAGNOSIS — M9904 Segmental and somatic dysfunction of sacral region: Secondary | ICD-10-CM | POA: Diagnosis not present

## 2017-03-23 DIAGNOSIS — M5417 Radiculopathy, lumbosacral region: Secondary | ICD-10-CM | POA: Diagnosis not present

## 2017-03-23 DIAGNOSIS — M5136 Other intervertebral disc degeneration, lumbar region: Secondary | ICD-10-CM | POA: Diagnosis not present

## 2017-03-23 DIAGNOSIS — M545 Low back pain: Secondary | ICD-10-CM | POA: Diagnosis not present

## 2017-03-23 DIAGNOSIS — M9904 Segmental and somatic dysfunction of sacral region: Secondary | ICD-10-CM | POA: Diagnosis not present

## 2017-03-23 DIAGNOSIS — M791 Myalgia: Secondary | ICD-10-CM | POA: Diagnosis not present

## 2017-03-23 DIAGNOSIS — M9903 Segmental and somatic dysfunction of lumbar region: Secondary | ICD-10-CM | POA: Diagnosis not present

## 2017-03-23 DIAGNOSIS — M5416 Radiculopathy, lumbar region: Secondary | ICD-10-CM | POA: Diagnosis not present

## 2017-03-23 DIAGNOSIS — M5137 Other intervertebral disc degeneration, lumbosacral region: Secondary | ICD-10-CM | POA: Diagnosis not present

## 2017-03-23 DIAGNOSIS — R202 Paresthesia of skin: Secondary | ICD-10-CM | POA: Diagnosis not present

## 2017-03-27 ENCOUNTER — Encounter: Payer: Self-pay | Admitting: Family Medicine

## 2017-03-27 ENCOUNTER — Ambulatory Visit (INDEPENDENT_AMBULATORY_CARE_PROVIDER_SITE_OTHER): Payer: Medicare HMO | Admitting: Family Medicine

## 2017-03-27 VITALS — BP 116/70 | HR 68 | Temp 97.8°F | Resp 16 | Wt 217.0 lb

## 2017-03-27 DIAGNOSIS — E782 Mixed hyperlipidemia: Secondary | ICD-10-CM

## 2017-03-27 DIAGNOSIS — E119 Type 2 diabetes mellitus without complications: Secondary | ICD-10-CM

## 2017-03-27 LAB — POCT GLYCOSYLATED HEMOGLOBIN (HGB A1C)
ESTIMATED AVERAGE GLUCOSE: 137
HEMOGLOBIN A1C: 6.4

## 2017-03-27 NOTE — Progress Notes (Signed)
Patient: Aaron Rivera Male    DOB: 1930-02-16   83 y.o.   MRN: [id number] Visit Date: 03/27/2017  Today's Provider: Megan Mansichard Gilbert Jr, MD   Chief Complaint  Patient presents with  . Diabetes  . Hyperlipidemia   Subjective:    HPI  Diabetes Mellitus Type II, Follow-up:   Lab Results  Component Value Date   HGBA1C 6.4 03/27/2017   HGBA1C 6.6 (H) 07/28/2016   HGBA1C 6.9 02/23/2016    Last seen for diabetes 4 months ago.  Management since then includes no changes. He reports good compliance with treatment. He is not having side effects.  Current symptoms include none and have been stable. Home blood sugar records: fasting range: 132  Episodes of hypoglycemia? no   Current Insulin Regimen: none Most Recent Eye Exam: every 6 months. Weight trend: stable Prior visit with dietician: no Current diet: well balanced Current exercise: gardening  Pertinent Labs:    Component Value Date/Time   CHOL 173 07/28/2016 1042   TRIG 107 07/28/2016 1042   HDL 31 (L) 07/28/2016 1042   LDLCALC 121 (H) 07/28/2016 1042   CREATININE 1.57 (H) 07/28/2016 1042   CREATININE 1.25 10/03/2013 0411    Wt Readings from Last 3 Encounters:  03/27/17 217 lb (98.4 kg)  03/20/17 215 lb (97.5 kg)  12/12/16 218 lb (98.9 kg)      Lipid/Cholesterol, Follow-up:   Last seen for this4 months ago.  Management changes since that visit include discontinuing Pravastatin and starting Rosuvastatin 20mg  daily. . Last Lipid Panel:    Component Value Date/Time   CHOL 173 07/28/2016 1042   TRIG 107 07/28/2016 1042   HDL 31 (L) 07/28/2016 1042   LDLCALC 121 (H) 07/28/2016 1042    Risk factors for vascular disease include diabetes mellitus  He reports good compliance with treatment. He is not having side effects.  Current symptoms include none and have been stable. Weight trend: stable Prior visit with dietician: no Current diet: well balanced Current exercise: yard work  [employer]t  Readings from Last 3 Encounters:  03/27/17 217 lb (98.4 kg)  03/20/17 215 lb (97.5 kg)  12/12/16 218 lb (98.9 kg)       No Known Allergies   Current Outpatient Prescriptions:  .  captopril (CAPOTEN) 25 MG tablet, TAKE 1/2 TABLET BY MOUTH TWICE A DAY, Disp: 90 tablet, Rfl: 3 .  diltiazem (CARDIZEM CD) 300 MG 24 hr capsule, TAKE 1 CAPSULE EVERY DAY, Disp: 90 capsule, Rfl: 3 .  fluticasone (FLONASE) 50 MCG/ACT nasal spray, Place into the nose., Disp: , Rfl:  .  hydrochlorothiazide (HYDRODIURIL) 25 MG tablet, TAKE 1 TABLET EVERY DAY, Disp: 90 tablet, Rfl: 3 .  KLOR-CON 10 10 MEQ tablet, TAKE 2 TABLETS EVERY DAY, Disp: 90 tablet, Rfl: 3 .  meclizine (ANTIVERT) 25 MG tablet, Take by mouth., Disp: , Rfl:  .  omeprazole (PRILOSEC) 20 MG capsule, TAKE 1 CAPSULE EVERY DAY, Disp: 90 capsule, Rfl: 4 .  ONETOUCH VERIO test strip, USE 2 TIMES A DAY AS INSTRUCTED, Disp: 100 each, Rfl: 12 .  pioglitazone (ACTOS) 15 MG tablet, Take 1 tablet (15 mg total) by mouth every morning., Disp: 90 tablet, Rfl: 3 .  ranitidine (ZANTAC) 150 MG tablet, TAKE 1 TABLET BY MOUTH AT BEDTIME, Disp: 30 tablet, Rfl: 12 .  rosuvastatin (CRESTOR) 20 MG tablet, Take 1 tablet (20 mg total) by mouth daily., Disp: 90 tablet, Rfl: 3 .  terazosin (HYTRIN) 2 MG capsule,  Take 1 capsule (2 mg total) by mouth daily., Disp: 90 capsule, Rfl: 3  Review of Systems  Constitutional: Negative.   Respiratory: Negative.   Cardiovascular: Negative.   Endocrine: Negative.   Musculoskeletal: Negative.   Neurological: Negative.     Social History  Substance Use Topics  . Smoking status: Former Smoker    Types: Cigarettes  . Smokeless tobacco: Former Neurosurgeon    Quit date: 11/20/1969  . Alcohol use No   Objective:   BP 116/70 (BP Location: Right Arm, Patient Position: Sitting, Cuff Size: Large)   Pulse 68   Temp 97.8 F (36.6 C)   Resp 16   Wt 217 lb (98.4 kg)   BMI 29.43 kg/m  Vitals:   03/27/17 0848  BP: 116/70  Pulse: 68    Resp: 16  Temp: 97.8 F (36.6 C)  Weight: 217 lb (98.4 kg)     Physical Exam  Constitutional: He is oriented to person, place, and time. He appears well-developed and well-nourished.  Neck: Normal range of motion. Neck supple.  Cardiovascular: Normal rate, regular rhythm and normal heart sounds.   Pulmonary/Chest: Effort normal and breath sounds normal.  Musculoskeletal: Normal range of motion. He exhibits no edema.  Neurological: He is alert and oriented to person, place, and time.  Skin: Skin is warm and dry.  Psychiatric: He has a normal mood and affect. His behavior is normal. Judgment and thought content normal.        Assessment & Plan:     1. Type 2 diabetes mellitus without complication, without long-term current use of insulin (HCC)  - POCT glycosylated hemoglobin (Hb A1C)--6.4 today.  2. Mixed hyperlipidemia 3.CAD       I have done the exam and reviewed the above chart and it is accurate to the best of my knowledge. Dentist has been used in this note in any air is in the dictation or transcription are unintentional.  Aaron Mans, MD  [facility]

## 2017-03-28 DIAGNOSIS — R202 Paresthesia of skin: Secondary | ICD-10-CM | POA: Diagnosis not present

## 2017-03-28 DIAGNOSIS — M5136 Other intervertebral disc degeneration, lumbar region: Secondary | ICD-10-CM | POA: Diagnosis not present

## 2017-03-28 DIAGNOSIS — M5416 Radiculopathy, lumbar region: Secondary | ICD-10-CM | POA: Diagnosis not present

## 2017-03-28 DIAGNOSIS — M9903 Segmental and somatic dysfunction of lumbar region: Secondary | ICD-10-CM | POA: Diagnosis not present

## 2017-03-28 DIAGNOSIS — M9904 Segmental and somatic dysfunction of sacral region: Secondary | ICD-10-CM | POA: Diagnosis not present

## 2017-03-28 DIAGNOSIS — M545 Low back pain: Secondary | ICD-10-CM | POA: Diagnosis not present

## 2017-03-28 DIAGNOSIS — M5417 Radiculopathy, lumbosacral region: Secondary | ICD-10-CM | POA: Diagnosis not present

## 2017-03-28 DIAGNOSIS — M791 Myalgia: Secondary | ICD-10-CM | POA: Diagnosis not present

## 2017-03-28 DIAGNOSIS — M5137 Other intervertebral disc degeneration, lumbosacral region: Secondary | ICD-10-CM | POA: Diagnosis not present

## 2017-03-28 LAB — HEPATIC FUNCTION PANEL
ALK PHOS: 90 IU/L (ref 39–117)
ALT: 10 IU/L (ref 0–44)
AST: 19 IU/L (ref 0–40)
Albumin: 4.1 g/dL (ref 3.5–4.7)
BILIRUBIN, DIRECT: 0.16 mg/dL (ref 0.00–0.40)
Bilirubin Total: 0.5 mg/dL (ref 0.0–1.2)
TOTAL PROTEIN: 7.5 g/dL (ref 6.0–8.5)

## 2017-03-28 LAB — LIPID PANEL
CHOLESTEROL TOTAL: 165 mg/dL (ref 100–199)
Chol/HDL Ratio: 5.3 ratio — ABNORMAL HIGH (ref 0.0–5.0)
HDL: 31 mg/dL — AB (ref >39)
LDL Calculated: 111 mg/dL — ABNORMAL HIGH (ref 0–99)
TRIGLYCERIDES: 116 mg/dL (ref 0–149)
VLDL Cholesterol Cal: 23 mg/dL (ref 5–40)

## 2017-03-29 ENCOUNTER — Telehealth: Payer: Self-pay

## 2017-03-29 NOTE — Telephone Encounter (Signed)
-----   Message from Maple Hudsonichard L Gilbert Jr., MD sent at 03/29/2017 11:27 AM EDT ----- stable

## 2017-03-29 NOTE — Telephone Encounter (Signed)
Unable to reach  Patient at this time. KW

## 2017-03-30 DIAGNOSIS — M5417 Radiculopathy, lumbosacral region: Secondary | ICD-10-CM | POA: Diagnosis not present

## 2017-03-30 DIAGNOSIS — M545 Low back pain: Secondary | ICD-10-CM | POA: Diagnosis not present

## 2017-03-30 DIAGNOSIS — M5137 Other intervertebral disc degeneration, lumbosacral region: Secondary | ICD-10-CM | POA: Diagnosis not present

## 2017-03-30 DIAGNOSIS — M791 Myalgia: Secondary | ICD-10-CM | POA: Diagnosis not present

## 2017-03-30 DIAGNOSIS — M9904 Segmental and somatic dysfunction of sacral region: Secondary | ICD-10-CM | POA: Diagnosis not present

## 2017-03-30 DIAGNOSIS — M5416 Radiculopathy, lumbar region: Secondary | ICD-10-CM | POA: Diagnosis not present

## 2017-03-30 DIAGNOSIS — M9903 Segmental and somatic dysfunction of lumbar region: Secondary | ICD-10-CM | POA: Diagnosis not present

## 2017-03-30 DIAGNOSIS — R202 Paresthesia of skin: Secondary | ICD-10-CM | POA: Diagnosis not present

## 2017-03-30 DIAGNOSIS — M5136 Other intervertebral disc degeneration, lumbar region: Secondary | ICD-10-CM | POA: Diagnosis not present

## 2017-03-31 NOTE — Telephone Encounter (Signed)
Pt advised-aa 

## 2017-04-12 ENCOUNTER — Other Ambulatory Visit: Payer: Self-pay | Admitting: Family Medicine

## 2017-04-12 NOTE — Telephone Encounter (Signed)
Last ov 03/27/17  Last filled 03/10/16 Please review. Thank you. sd

## 2017-04-18 ENCOUNTER — Ambulatory Visit
Admission: RE | Admit: 2017-04-18 | Discharge: 2017-04-18 | Disposition: A | Discharge status: Home / Self Care | Payer: Medicare HMO | Source: Ambulatory Visit | Attending: Family Medicine | Admitting: Family Medicine

## 2017-04-18 ENCOUNTER — Ambulatory Visit (INDEPENDENT_AMBULATORY_CARE_PROVIDER_SITE_OTHER): Payer: Medicare HMO | Admitting: Family Medicine

## 2017-04-18 VITALS — BP 132/46 | HR 72 | Resp 18 | Wt 217.0 lb

## 2017-04-18 DIAGNOSIS — M5090 Cervical disc disorder, unspecified, unspecified cervical region: Secondary | ICD-10-CM

## 2017-04-18 DIAGNOSIS — I6523 Occlusion and stenosis of bilateral carotid arteries: Secondary | ICD-10-CM | POA: Diagnosis not present

## 2017-04-18 DIAGNOSIS — M62549 Muscle wasting and atrophy, not elsewhere classified, unspecified hand: Secondary | ICD-10-CM

## 2017-04-18 DIAGNOSIS — M79642 Pain in left hand: Secondary | ICD-10-CM | POA: Diagnosis not present

## 2017-04-18 DIAGNOSIS — M79641 Pain in right hand: Secondary | ICD-10-CM | POA: Diagnosis not present

## 2017-04-18 NOTE — Progress Notes (Signed)
Aaron Rivera  MRN: [id number] DOB: [birth year]  Subjective:  HPI  Patient states he has noticed that his right hand grip strength has decreased. Also noticed muscle wasting in right hand. No pain present. Patient Active Problem List   Diagnosis Date Noted  . PAD (peripheral artery disease) (HCC) 03/20/2017  . Carotid stenosis 03/20/2017  . AAA (abdominal aortic aneurysm) (HCC) 06/16/2015  . Allergic rhinitis 06/16/2015  . AI (aortic incompetence) 06/16/2015  . Benign prostatic hypertrophy with lower urinary tract symptoms (LUTS) 06/16/2015  . Coronary artery abnormality 06/16/2015  . ED (erectile dysfunction) of organic origin 06/16/2015  . Essential (primary) hypertension 06/16/2015  . Acid reflux 06/16/2015  . HLD (hyperlipidemia) 06/16/2015  . Arthritis, degenerative 06/16/2015  . Allergic rhinitis, seasonal 06/16/2015  . Diabetes mellitus, type 2 (HCC) 06/16/2015    No past medical history on file.  Social History   Social History  . Marital status: Married    Spouse name: N/A  . Number of children: N/A  . Years of education: N/A   Occupational History  . Not on file.   Social History Main Topics  . Smoking status: Former Smoker    Types: Cigarettes  . Smokeless tobacco: Former Neurosurgeon    Quit date: 11/20/1969  . Alcohol use No  . Drug use: No  . Sexual activity: No   Other Topics Concern  . Not on file   Social History Narrative  . No narrative on file    Outpatient Encounter Prescriptions as of 04/18/2017  Medication Sig Note  . captopril (CAPOTEN) 25 MG tablet TAKE 1/2 TABLET BY MOUTH TWICE A DAY   . diltiazem (CARDIZEM CD) 300 MG 24 hr capsule TAKE 1 CAPSULE EVERY DAY   . fluticasone (FLONASE) 50 MCG/ACT nasal spray Place into the nose. 06/16/2015: Received from: [facility]  . hydrochlorothiazide (HYDRODIURIL) 25 MG tablet TAKE 1 TABLET EVERY DAY   . KLOR-CON 10 10 MEQ tablet TAKE 2 TABLETS EVERY DAY   . meclizine (ANTIVERT) 25  MG tablet Take by mouth. 06/16/2015: Received from: [facility]  . omeprazole (PRILOSEC) 20 MG capsule TAKE 1 CAPSULE EVERY DAY   . ONETOUCH VERIO test strip USE 2 TIMES A DAY AS INSTRUCTED   . pioglitazone (ACTOS) 15 MG tablet Take 1 tablet (15 mg total) by mouth every morning.   . ranitidine (ZANTAC) 150 MG tablet TAKE 1 TABLET BY MOUTH AT BEDTIME   . rosuvastatin (CRESTOR) 20 MG tablet Take 1 tablet (20 mg total) by mouth daily.   Marland Kitchen terazosin (HYTRIN) 2 MG capsule Take 1 capsule (2 mg total) by mouth daily.    No facility-administered encounter medications on file as of 04/18/2017.     No Known Allergies  Review of Systems  Constitutional: Positive for malaise/fatigue.  Eyes: Negative.   Respiratory: Negative.   Cardiovascular: Negative.   Gastrointestinal: Negative.   Musculoskeletal:       Decreased grip strength  Neurological: Negative.   Endo/Heme/Allergies: Negative.   Psychiatric/Behavioral: Negative.     Objective:  BP (!) 132/46   Pulse 72   Resp 18   Wt 217 lb (98.4 kg)   BMI 29.43 kg/m   Physical Exam  Constitutional: He is oriented to person, place, and time and well-developed, well-nourished, and in no distress.  HENT:  Head: Normocephalic and atraumatic.  Eyes: Conjunctivae are normal. No scleral icterus.  Neck: No thyromegaly present.  Cardiovascular: Normal rate, regular rhythm and normal heart sounds.  Pulmonary/Chest: Effort normal and breath sounds normal.  Neurological: He is alert and oriented to person, place, and time.  Wasting of interosseous muscles of both hands--R>L. Neurosensory normal.  Skin: Skin is warm and dry.  Psychiatric: Mood, memory, affect and judgment normal.  Vitals reviewed.   Assessment and Plan :  Cervical Disc Disease vs low motor neuropathy Refer to neurology TIIDM  I have done the exam and reviewed the chart and it is accurate to the best of my knowledge. DentistDragon  technology has been used and  any  errors in dictation or transcription are unintentional. Julieanne Mansonichard Gilbert M.D. [facility]

## 2017-04-19 ENCOUNTER — Telehealth: Payer: Self-pay

## 2017-04-19 NOTE — Telephone Encounter (Signed)
Yes--neurology

## 2017-04-19 NOTE — Telephone Encounter (Signed)
Pt advised. Pt is asking if you still recommend referral to specialist? Please advise. Allene DillonEmily Drozdowski, CMA

## 2017-04-19 NOTE — Telephone Encounter (Signed)
-----   Message from Maple Hudsonichard L Gilbert Jr., MD sent at 04/19/2017  9:02 AM EDT ----- Merwyn KatosXray OK

## 2017-05-04 DIAGNOSIS — H04123 Dry eye syndrome of bilateral lacrimal glands: Secondary | ICD-10-CM | POA: Diagnosis not present

## 2017-06-15 DIAGNOSIS — R69 Illness, unspecified: Secondary | ICD-10-CM | POA: Diagnosis not present

## 2017-06-21 DIAGNOSIS — Z6831 Body mass index (BMI) 31.0-31.9, adult: Secondary | ICD-10-CM | POA: Diagnosis not present

## 2017-06-21 DIAGNOSIS — I10 Essential (primary) hypertension: Secondary | ICD-10-CM | POA: Diagnosis not present

## 2017-06-21 DIAGNOSIS — N402 Nodular prostate without lower urinary tract symptoms: Secondary | ICD-10-CM | POA: Diagnosis not present

## 2017-06-21 DIAGNOSIS — E785 Hyperlipidemia, unspecified: Secondary | ICD-10-CM | POA: Diagnosis not present

## 2017-06-21 DIAGNOSIS — Z0000 Encounter for general adult medical examination without abnormal findings: Secondary | ICD-10-CM | POA: Diagnosis not present

## 2017-06-21 DIAGNOSIS — H409 Unspecified glaucoma: Secondary | ICD-10-CM | POA: Diagnosis not present

## 2017-06-21 DIAGNOSIS — K219 Gastro-esophageal reflux disease without esophagitis: Secondary | ICD-10-CM | POA: Diagnosis not present

## 2017-06-21 DIAGNOSIS — E119 Type 2 diabetes mellitus without complications: Secondary | ICD-10-CM | POA: Diagnosis not present

## 2017-06-21 DIAGNOSIS — E669 Obesity, unspecified: Secondary | ICD-10-CM | POA: Diagnosis not present

## 2017-06-21 DIAGNOSIS — I252 Old myocardial infarction: Secondary | ICD-10-CM | POA: Diagnosis not present

## 2017-06-23 ENCOUNTER — Other Ambulatory Visit: Payer: Self-pay | Admitting: Neurology

## 2017-06-23 DIAGNOSIS — E538 Deficiency of other specified B group vitamins: Secondary | ICD-10-CM | POA: Diagnosis not present

## 2017-06-23 DIAGNOSIS — M62542 Muscle wasting and atrophy, not elsewhere classified, left hand: Secondary | ICD-10-CM | POA: Diagnosis not present

## 2017-06-23 DIAGNOSIS — M62541 Muscle wasting and atrophy, not elsewhere classified, right hand: Secondary | ICD-10-CM | POA: Diagnosis not present

## 2017-06-23 DIAGNOSIS — R29898 Other symptoms and signs involving the musculoskeletal system: Secondary | ICD-10-CM | POA: Diagnosis not present

## 2017-06-23 DIAGNOSIS — E559 Vitamin D deficiency, unspecified: Secondary | ICD-10-CM | POA: Diagnosis not present

## 2017-06-30 ENCOUNTER — Ambulatory Visit
Admission: RE | Admit: 2017-06-30 | Discharge: 2017-06-30 | Disposition: A | Discharge status: Home / Self Care | Payer: Medicare HMO | Source: Ambulatory Visit | Attending: Neurology | Admitting: Neurology

## 2017-06-30 DIAGNOSIS — M4802 Spinal stenosis, cervical region: Secondary | ICD-10-CM | POA: Diagnosis not present

## 2017-06-30 DIAGNOSIS — R269 Unspecified abnormalities of gait and mobility: Secondary | ICD-10-CM | POA: Diagnosis not present

## 2017-06-30 DIAGNOSIS — R29898 Other symptoms and signs involving the musculoskeletal system: Secondary | ICD-10-CM

## 2017-06-30 DIAGNOSIS — M62541 Muscle wasting and atrophy, not elsewhere classified, right hand: Secondary | ICD-10-CM | POA: Diagnosis not present

## 2017-06-30 DIAGNOSIS — M62542 Muscle wasting and atrophy, not elsewhere classified, left hand: Secondary | ICD-10-CM | POA: Insufficient documentation

## 2017-06-30 DIAGNOSIS — R292 Abnormal reflex: Secondary | ICD-10-CM | POA: Insufficient documentation

## 2017-07-06 DIAGNOSIS — G9520 Unspecified cord compression: Secondary | ICD-10-CM | POA: Diagnosis not present

## 2017-07-06 DIAGNOSIS — E538 Deficiency of other specified B group vitamins: Secondary | ICD-10-CM | POA: Diagnosis not present

## 2017-07-06 DIAGNOSIS — M62541 Muscle wasting and atrophy, not elsewhere classified, right hand: Secondary | ICD-10-CM | POA: Diagnosis not present

## 2017-07-06 DIAGNOSIS — R29898 Other symptoms and signs involving the musculoskeletal system: Secondary | ICD-10-CM | POA: Diagnosis not present

## 2017-07-06 DIAGNOSIS — E559 Vitamin D deficiency, unspecified: Secondary | ICD-10-CM | POA: Diagnosis not present

## 2017-07-06 DIAGNOSIS — M62542 Muscle wasting and atrophy, not elsewhere classified, left hand: Secondary | ICD-10-CM | POA: Diagnosis not present

## 2017-07-11 ENCOUNTER — Other Ambulatory Visit: Payer: Self-pay | Admitting: Family Medicine

## 2017-07-11 DIAGNOSIS — M4802 Spinal stenosis, cervical region: Secondary | ICD-10-CM | POA: Diagnosis not present

## 2017-08-01 DIAGNOSIS — G5603 Carpal tunnel syndrome, bilateral upper limbs: Secondary | ICD-10-CM | POA: Diagnosis not present

## 2017-08-01 DIAGNOSIS — G5623 Lesion of ulnar nerve, bilateral upper limbs: Secondary | ICD-10-CM | POA: Diagnosis not present

## 2017-08-23 DIAGNOSIS — G5603 Carpal tunnel syndrome, bilateral upper limbs: Secondary | ICD-10-CM | POA: Diagnosis not present

## 2017-08-23 DIAGNOSIS — E559 Vitamin D deficiency, unspecified: Secondary | ICD-10-CM | POA: Diagnosis not present

## 2017-08-23 DIAGNOSIS — E538 Deficiency of other specified B group vitamins: Secondary | ICD-10-CM | POA: Diagnosis not present

## 2017-08-23 DIAGNOSIS — M62541 Muscle wasting and atrophy, not elsewhere classified, right hand: Secondary | ICD-10-CM | POA: Diagnosis not present

## 2017-08-23 DIAGNOSIS — M62542 Muscle wasting and atrophy, not elsewhere classified, left hand: Secondary | ICD-10-CM | POA: Diagnosis not present

## 2017-08-23 DIAGNOSIS — G5623 Lesion of ulnar nerve, bilateral upper limbs: Secondary | ICD-10-CM | POA: Diagnosis not present

## 2017-08-23 DIAGNOSIS — G9520 Unspecified cord compression: Secondary | ICD-10-CM | POA: Diagnosis not present

## 2017-08-26 DIAGNOSIS — E559 Vitamin D deficiency, unspecified: Secondary | ICD-10-CM | POA: Insufficient documentation

## 2017-09-13 ENCOUNTER — Other Ambulatory Visit: Payer: Self-pay | Admitting: Family Medicine

## 2017-09-13 NOTE — Telephone Encounter (Signed)
Pharmacy requesting refills. Thanks!  

## 2017-09-27 ENCOUNTER — Other Ambulatory Visit: Payer: Self-pay

## 2017-09-27 ENCOUNTER — Ambulatory Visit (INDEPENDENT_AMBULATORY_CARE_PROVIDER_SITE_OTHER): Payer: Medicare HMO | Admitting: Family Medicine

## 2017-09-27 VITALS — BP 102/48 | HR 82 | Temp 98.6°F | Resp 16 | Wt 214.8 lb

## 2017-09-27 DIAGNOSIS — Z23 Encounter for immunization: Secondary | ICD-10-CM | POA: Diagnosis not present

## 2017-09-27 DIAGNOSIS — K219 Gastro-esophageal reflux disease without esophagitis: Secondary | ICD-10-CM | POA: Diagnosis not present

## 2017-09-27 DIAGNOSIS — E119 Type 2 diabetes mellitus without complications: Secondary | ICD-10-CM

## 2017-09-27 DIAGNOSIS — I10 Essential (primary) hypertension: Secondary | ICD-10-CM | POA: Diagnosis not present

## 2017-09-27 DIAGNOSIS — E782 Mixed hyperlipidemia: Secondary | ICD-10-CM | POA: Diagnosis not present

## 2017-09-27 DIAGNOSIS — R69 Illness, unspecified: Secondary | ICD-10-CM | POA: Diagnosis not present

## 2017-09-27 LAB — COMPLETE METABOLIC PANEL WITH GFR
AG RATIO: 1.1 (calc) (ref 1.0–2.5)
ALBUMIN MSPROF: 3.8 g/dL (ref 3.6–5.1)
ALT: 10 U/L (ref 9–46)
AST: 16 U/L (ref 10–35)
Alkaline phosphatase (APISO): 84 U/L (ref 40–115)
BUN / CREAT RATIO: 13 (calc) (ref 6–22)
BUN: 24 mg/dL (ref 7–25)
CALCIUM: 9.2 mg/dL (ref 8.6–10.3)
CO2: 28 mmol/L (ref 20–32)
Chloride: 103 mmol/L (ref 98–110)
Creat: 1.87 mg/dL — ABNORMAL HIGH (ref 0.70–1.11)
GFR, EST AFRICAN AMERICAN: 37 mL/min/{1.73_m2} — AB (ref >=60)
GFR, EST NON AFRICAN AMERICAN: 32 mL/min/{1.73_m2} — AB (ref >=60)
Globulin: 3.5 g/dL (calc) (ref 1.9–3.7)
Glucose, Bld: 135 mg/dL — ABNORMAL HIGH (ref 65–99)
POTASSIUM: 3.4 mmol/L — AB (ref 3.5–5.3)
Sodium: 139 mmol/L (ref 135–146)
TOTAL PROTEIN: 7.3 g/dL (ref 6.1–8.1)
Total Bilirubin: 0.6 mg/dL (ref 0.2–1.2)

## 2017-09-27 LAB — CBC WITH DIFFERENTIAL/PLATELET
BASOS ABS: 10 {cells}/uL (ref 0–200)
Basophils Relative: 0.2 %
EOS ABS: 31 {cells}/uL (ref 15–500)
Eosinophils Relative: 0.6 %
HCT: 30.8 % — ABNORMAL LOW (ref 38.5–50.0)
Hemoglobin: 9.9 g/dL — ABNORMAL LOW (ref 13.2–17.1)
Lymphs Abs: 1443 cells/uL (ref 850–3900)
MCH: 29.6 pg (ref 27.0–33.0)
MCHC: 32.1 g/dL (ref 32.0–36.0)
MCV: 91.9 fL (ref 80.0–100.0)
MPV: 11.9 fL (ref 7.5–12.5)
Monocytes Relative: 26.7 %
NEUTROS PCT: 44.2 %
Neutro Abs: 2254 cells/uL (ref 1500–7800)
PLATELETS: 100 10*3/uL — AB (ref 140–400)
RBC: 3.35 10*6/uL — ABNORMAL LOW (ref 4.20–5.80)
RDW: 11.6 % (ref 11.0–15.0)
TOTAL LYMPHOCYTE: 28.3 %
WBC mixed population: 1362 cells/uL — ABNORMAL HIGH (ref 200–950)
WBC: 5.1 10*3/uL (ref 3.8–10.8)

## 2017-09-27 LAB — LIPID PANEL
CHOL/HDL RATIO: 4.6 (calc) (ref <5.0)
Cholesterol: 144 mg/dL (ref <200)
HDL: 31 mg/dL — AB (ref >40)
LDL CHOLESTEROL (CALC): 96 mg/dL
NON-HDL CHOLESTEROL (CALC): 113 mg/dL (ref <130)
TRIGLYCERIDES: 84 mg/dL (ref <150)

## 2017-09-27 LAB — POCT UA - MICROALBUMIN: Microalbumin Ur, POC: 50 mg/L

## 2017-09-27 LAB — TSH: TSH: 2.34 m[IU]/L (ref 0.40–4.50)

## 2017-09-27 LAB — POCT GLYCOSYLATED HEMOGLOBIN (HGB A1C): Hemoglobin A1C: 6.4

## 2017-09-27 MED ORDER — OMEPRAZOLE 20 MG PO CPDR: 20.0000 mg | DELAYED_RELEASE_CAPSULE | Freq: Every day | ORAL | 0 refills | Status: AC | PRN

## 2017-09-27 NOTE — Patient Instructions (Signed)
Stop HCTZ. Take Omeprazole as needed only not daily if you are able to.

## 2017-09-27 NOTE — Progress Notes (Signed)
Aaron Rivera  MRN: [id number] DOB: Apr 28, 1930  Subjective:  HPI  Patient is here for follow up. Last routine office visit was on 03/27/17. Last lab work was 03/27/17 Lipid only, routine lab work was done on 07/28/16. DM: patient is checking his sugar in the morning, this morning reading was 118 and the past month around 125. Had eye exam within a year. Lab Results  Component Value Date   HGBA1C 6.4 03/27/2017   Wt Readings from Last 3 Encounters:  09/27/17 214 lb 12.8 oz (97.4 kg)  04/18/17 217 lb (98.4 kg)  03/27/17 217 lb (98.4 kg)   GERD: symptoms are controlled. Taking Omeprazole daily.  Patient Active Problem List   Diagnosis Date Noted  . Vitamin D deficiency, unspecified 08/26/2017  . PAD (peripheral artery disease) (HCC) 03/20/2017  . Carotid stenosis 03/20/2017  . AAA (abdominal aortic aneurysm) (HCC) 06/16/2015  . Allergic rhinitis 06/16/2015  . AI (aortic incompetence) 06/16/2015  . Benign prostatic hypertrophy with lower urinary tract symptoms (LUTS) 06/16/2015  . Coronary artery abnormality 06/16/2015  . ED (erectile dysfunction) of organic origin 06/16/2015  . Essential (primary) hypertension 06/16/2015  . Acid reflux 06/16/2015  . HLD (hyperlipidemia) 06/16/2015  . Arthritis, degenerative 06/16/2015  . Allergic rhinitis, seasonal 06/16/2015  . Diabetes mellitus, type 2 (HCC) 06/16/2015    No past medical history on file.  Social History   Socioeconomic History  . Marital status: Widowed    Spouse name: Not on file  . Number of children: Not on file  . Years of education: Not on file  . Highest education level: Not on file  Social Needs  . Financial resource strain: Not on file  . Food insecurity - worry: Not on file  . Food insecurity - inability: Not on file  . Transportation needs - medical: Not on file  . Transportation needs - non-medical: Not on file  Occupational History  . Not on file  Tobacco Use  . Smoking status: Former Smoker   Types: Cigarettes  . Smokeless tobacco: Former NeurosurgeonUser    Quit date: 11/20/1969  Substance and Sexual Activity  . Alcohol use: No    Alcohol/week: 0.0 oz  . Drug use: No  . Sexual activity: No  Other Topics Concern  . Not on file  Social History Narrative  . Not on file    Outpatient Encounter Medications as of 09/27/2017  Medication Sig Note  . captopril (CAPOTEN) 25 MG tablet TAKE 1/2 TABLET BY MOUTH TWICE A DAY   . diltiazem (CARDIZEM CD) 300 MG 24 hr capsule TAKE 1 CAPSULE EVERY DAY   . hydrochlorothiazide (HYDRODIURIL) 25 MG tablet TAKE 1 TABLET EVERY DAY   . KLOR-CON 10 10 MEQ tablet TAKE 2 TABLETS EVERY DAY   . omeprazole (PRILOSEC) 20 MG capsule TAKE 1 CAPSULE EVERY DAY   . ONETOUCH VERIO test strip USE 2 TIMES A DAY AS INSTRUCTED   . pioglitazone (ACTOS) 15 MG tablet TAKE 1 TABLET (15 MG TOTAL) BY MOUTH EVERY MORNING.   . rosuvastatin (CRESTOR) 20 MG tablet Take 1 tablet (20 mg total) by mouth daily.   Marland Kitchen. terazosin (HYTRIN) 2 MG capsule Take 1 capsule (2 mg total) by mouth daily.   . [DISCONTINUED] fluticasone (FLONASE) 50 MCG/ACT nasal spray Place into the nose. 06/16/2015: [facility]  . [DISCONTINUED] meclizine (ANTIVERT) 25 MG tablet Take by mouth. 06/16/2015: [facility]  . [DISCONTINUED] ranitidine (ZANTAC) 150 MG tablet TAKE 1 TABLET  BY MOUTH AT BEDTIME    No facility-administered encounter medications on file as of 09/27/2017.     No Known Allergies  Review of Systems  Constitutional: Negative.   Respiratory: Negative.   Cardiovascular: Negative.   Gastrointestinal: Negative.   Musculoskeletal: Negative.        Joint stiffness  Neurological: Positive for dizziness (sometimes).    Objective:  BP (!) 102/48   Pulse 82   Temp 98.6 F (37 C)   Resp 16   Wt 214 lb 12.8 oz (97.4 kg)   BMI 29.13 kg/m   Physical Exam  Constitutional: He is oriented to person, place, and time and well-developed,  well-nourished, and in no distress.  HENT:  Head: Normocephalic and atraumatic.  Eyes: Conjunctivae are normal. Pupils are equal, round, and reactive to light. No scleral icterus.  Neck: No thyromegaly present.  Cardiovascular: Normal rate, regular rhythm, normal heart sounds and intact distal pulses. Exam reveals no gallop.  No murmur heard. Pulmonary/Chest: Effort normal and breath sounds normal. No respiratory distress. He has no wheezes.  Musculoskeletal: He exhibits edema. He exhibits no tenderness.  1+ edema.  Neurological: He is alert and oriented to person, place, and time. Gait normal. GCS score is 15.  Diabetic foot exam normal.  Skin: Skin is warm and dry.  Psychiatric: Mood, memory, affect and judgment normal.   Diabetic Foot Exam - Simple   Simple Foot Form Diabetic Foot exam was performed with the following findings:  Yes 09/27/2017  8:51 AM  Visual Inspection No deformities, no ulcerations, no other skin breakdown bilaterally:  Yes Sensation Testing Intact to touch and monofilament testing bilaterally:  Yes Pulse Check Posterior Tibialis and Dorsalis pulse intact bilaterally:  Yes Comments     Assessment and Plan :  1. Type 2 diabetes mellitus without complication, without long-term current use of insulin (HCC) 6.4 today. Stable. - TSH - POCT HgB A1C - POCT UA - Microalbumin  2. Mixed hyperlipidemia - Lipid Profile - Comprehensive metabolic panel  3. Essential (primary) hypertension D/C HCTZ-low norma reading today. Patient does get dizzy at times. - Comprehensive metabolic panel - CBC with Differential/Platelet  4. Need for influenza vaccination - Flu vaccine HIGH DOSE PF (Fluzone High dose)  5. Gastroesophageal reflux disease, esophagitis presence not specified Advised patient to take omeprazole as needed only - TSH 6.CAD Risk factors treated.  HPI, Exam and A&P transcribed by Domingo CockingAnastasiya Hopkins, RMA under direction and in the presence of Julieanne Mansonichard  Carlen Rebuck, MD. I have done the exam and reviewed the chart and it is accurate to the best of my knowledge. DentistDragon  technology has been used and  any errors in dictation or transcription are unintentional. Julieanne Mansonichard Marlina Cataldi M.D. [facility]

## 2017-10-02 ENCOUNTER — Other Ambulatory Visit: Payer: Self-pay | Admitting: Emergency Medicine

## 2017-10-02 DIAGNOSIS — G629 Polyneuropathy, unspecified: Secondary | ICD-10-CM | POA: Diagnosis not present

## 2017-10-02 DIAGNOSIS — D649 Anemia, unspecified: Secondary | ICD-10-CM

## 2017-10-03 LAB — CBC WITH DIFFERENTIAL/PLATELET
BASOS PCT: 0.4 %
Basophils Absolute: 21 cells/uL (ref 0–200)
EOS PCT: 0.4 %
Eosinophils Absolute: 21 cells/uL (ref 15–500)
HEMATOCRIT: 30.5 % — AB (ref 38.5–50.0)
HEMOGLOBIN: 9.7 g/dL — AB (ref 13.2–17.1)
LYMPHS ABS: 1733 {cells}/uL (ref 850–3900)
MCH: 29.8 pg (ref 27.0–33.0)
MCHC: 31.8 g/dL — ABNORMAL LOW (ref 32.0–36.0)
MCV: 93.6 fL (ref 80.0–100.0)
MPV: 12.5 fL (ref 7.5–12.5)
Monocytes Relative: 24.9 %
NEUTROS ABS: 2205 {cells}/uL (ref 1500–7800)
NEUTROS PCT: 41.6 %
Platelets: 96 10*3/uL — ABNORMAL LOW (ref 140–400)
RBC: 3.26 10*6/uL — AB (ref 4.20–5.80)
RDW: 11.7 % (ref 11.0–15.0)
Total Lymphocyte: 32.7 %
WBC: 5.3 10*3/uL (ref 3.8–10.8)
WBCMIX: 1320 {cells}/uL — AB (ref 200–950)

## 2017-10-03 LAB — IRON: IRON: 64 ug/dL (ref 50–180)

## 2017-10-03 LAB — VITAMIN B12: Vitamin B-12: 648 pg/mL (ref 200–1100)

## 2017-10-03 LAB — FOLATE: FOLATE: 10.1 ng/mL

## 2017-10-04 ENCOUNTER — Telehealth: Payer: Self-pay

## 2017-10-04 DIAGNOSIS — D649 Anemia, unspecified: Secondary | ICD-10-CM

## 2017-10-04 NOTE — Telephone Encounter (Signed)
Patient advised. GI referral ordered. Repeat labs ordered as future orders. Patient advised to call back in 1-2 weeks to request lab slip.

## 2017-10-04 NOTE — Telephone Encounter (Signed)
-----   Message from Maple Hudsonichard L Gilbert Jr., MD sent at 10/04/2017  8:11 AM EST ----- GI referral for anemia. If that evaluation is normal  then refer to hematology. Repeat CBC, iron, B12 and folate in 1-2 weeks.

## 2017-10-11 ENCOUNTER — Other Ambulatory Visit: Payer: Self-pay | Admitting: Family Medicine

## 2017-10-16 ENCOUNTER — Other Ambulatory Visit: Payer: Self-pay | Admitting: Family Medicine

## 2017-10-18 ENCOUNTER — Other Ambulatory Visit: Payer: Self-pay | Admitting: Emergency Medicine

## 2017-10-18 DIAGNOSIS — D649 Anemia, unspecified: Secondary | ICD-10-CM

## 2017-10-19 LAB — CBC WITH DIFFERENTIAL/PLATELET
BASOS ABS: 21 {cells}/uL (ref 0–200)
Basophils Relative: 0.4 %
EOS PCT: 0.4 %
Eosinophils Absolute: 21 cells/uL (ref 15–500)
HEMATOCRIT: 30 % — AB (ref 38.5–50.0)
Hemoglobin: 9.6 g/dL — ABNORMAL LOW (ref 13.2–17.1)
Lymphs Abs: 1524 cells/uL (ref 850–3900)
MCH: 29.9 pg (ref 27.0–33.0)
MCHC: 32 g/dL (ref 32.0–36.0)
MCV: 93.5 fL (ref 80.0–100.0)
MONOS PCT: 26.8 %
MPV: 11.9 fL (ref 7.5–12.5)
NEUTROS PCT: 43.1 %
Neutro Abs: 2241 cells/uL (ref 1500–7800)
PLATELETS: 92 10*3/uL — AB (ref 140–400)
RBC: 3.21 10*6/uL — ABNORMAL LOW (ref 4.20–5.80)
RDW: 11.7 % (ref 11.0–15.0)
TOTAL LYMPHOCYTE: 29.3 %
WBC mixed population: 1394 cells/uL — ABNORMAL HIGH (ref 200–950)
WBC: 5.2 10*3/uL (ref 3.8–10.8)

## 2017-10-19 LAB — FOLATE: Folate: 8 ng/mL

## 2017-10-19 LAB — IRON: Iron: 59 ug/dL (ref 50–180)

## 2017-10-19 LAB — VITAMIN B12: VITAMIN B 12: 586 pg/mL (ref 200–1100)

## 2017-10-26 DIAGNOSIS — D696 Thrombocytopenia, unspecified: Secondary | ICD-10-CM | POA: Diagnosis not present

## 2017-10-26 DIAGNOSIS — D519 Vitamin B12 deficiency anemia, unspecified: Secondary | ICD-10-CM | POA: Diagnosis not present

## 2017-10-26 DIAGNOSIS — E559 Vitamin D deficiency, unspecified: Secondary | ICD-10-CM | POA: Diagnosis not present

## 2017-10-27 ENCOUNTER — Other Ambulatory Visit: Payer: Self-pay

## 2017-10-27 DIAGNOSIS — D649 Anemia, unspecified: Secondary | ICD-10-CM

## 2017-10-27 LAB — IFOBT (OCCULT BLOOD): IMMUNOLOGICAL FECAL OCCULT BLOOD TEST: NEGATIVE

## 2017-10-27 NOTE — Progress Notes (Unsigned)
  Dr. Sullivan LoneGilbert,  [facility] that the patient returned today was negative.  He has been seen by GI and per the patient was told that everything looked ok.  Do you want patient referred to hematology? ED

## 2017-11-02 ENCOUNTER — Emergency Department: Payer: MEDICARE

## 2017-11-02 ENCOUNTER — Other Ambulatory Visit: Payer: Self-pay

## 2017-11-02 ENCOUNTER — Inpatient Hospital Stay
Admission: EM | Admit: 2017-11-02 | Discharge: 2017-11-04 | DRG: 291 | Disposition: A | Discharge status: Home / Self Care | Payer: MEDICARE | Attending: Internal Medicine | Admitting: Internal Medicine

## 2017-11-02 DIAGNOSIS — E785 Hyperlipidemia, unspecified: Secondary | ICD-10-CM | POA: Diagnosis present

## 2017-11-02 DIAGNOSIS — I5021 Acute systolic (congestive) heart failure: Secondary | ICD-10-CM | POA: Diagnosis not present

## 2017-11-02 DIAGNOSIS — I25119 Atherosclerotic heart disease of native coronary artery with unspecified angina pectoris: Secondary | ICD-10-CM | POA: Diagnosis not present

## 2017-11-02 DIAGNOSIS — I248 Other forms of acute ischemic heart disease: Secondary | ICD-10-CM | POA: Diagnosis present

## 2017-11-02 DIAGNOSIS — X58XXXA Exposure to other specified factors, initial encounter: Secondary | ICD-10-CM | POA: Diagnosis present

## 2017-11-02 DIAGNOSIS — I252 Old myocardial infarction: Secondary | ICD-10-CM | POA: Diagnosis not present

## 2017-11-02 DIAGNOSIS — R7989 Other specified abnormal findings of blood chemistry: Secondary | ICD-10-CM | POA: Diagnosis not present

## 2017-11-02 DIAGNOSIS — T502X5A Adverse effect of carbonic-anhydrase inhibitors, benzothiadiazides and other diuretics, initial encounter: Secondary | ICD-10-CM | POA: Diagnosis not present

## 2017-11-02 DIAGNOSIS — I429 Cardiomyopathy, unspecified: Secondary | ICD-10-CM | POA: Diagnosis present

## 2017-11-02 DIAGNOSIS — Z79899 Other long term (current) drug therapy: Secondary | ICD-10-CM | POA: Diagnosis not present

## 2017-11-02 DIAGNOSIS — I2510 Atherosclerotic heart disease of native coronary artery without angina pectoris: Secondary | ICD-10-CM | POA: Diagnosis not present

## 2017-11-02 DIAGNOSIS — I130 Hypertensive heart and chronic kidney disease with heart failure and stage 1 through stage 4 chronic kidney disease, or unspecified chronic kidney disease: Secondary | ICD-10-CM | POA: Diagnosis not present

## 2017-11-02 DIAGNOSIS — I509 Heart failure, unspecified: Secondary | ICD-10-CM

## 2017-11-02 DIAGNOSIS — D631 Anemia in chronic kidney disease: Secondary | ICD-10-CM | POA: Diagnosis present

## 2017-11-02 DIAGNOSIS — Z955 Presence of coronary angioplasty implant and graft: Secondary | ICD-10-CM | POA: Diagnosis not present

## 2017-11-02 DIAGNOSIS — M8580 Other specified disorders of bone density and structure, unspecified site: Secondary | ICD-10-CM | POA: Diagnosis present

## 2017-11-02 DIAGNOSIS — I214 Non-ST elevation (NSTEMI) myocardial infarction: Secondary | ICD-10-CM

## 2017-11-02 DIAGNOSIS — Z87891 Personal history of nicotine dependence: Secondary | ICD-10-CM

## 2017-11-02 DIAGNOSIS — N183 Chronic kidney disease, stage 3 (moderate): Secondary | ICD-10-CM | POA: Diagnosis not present

## 2017-11-02 DIAGNOSIS — E1122 Type 2 diabetes mellitus with diabetic chronic kidney disease: Secondary | ICD-10-CM | POA: Diagnosis present

## 2017-11-02 DIAGNOSIS — I5023 Acute on chronic systolic (congestive) heart failure: Secondary | ICD-10-CM | POA: Diagnosis not present

## 2017-11-02 DIAGNOSIS — E119 Type 2 diabetes mellitus without complications: Secondary | ICD-10-CM | POA: Diagnosis not present

## 2017-11-02 DIAGNOSIS — I10 Essential (primary) hypertension: Secondary | ICD-10-CM | POA: Diagnosis not present

## 2017-11-02 DIAGNOSIS — R0602 Shortness of breath: Secondary | ICD-10-CM | POA: Diagnosis not present

## 2017-11-02 DIAGNOSIS — I5033 Acute on chronic diastolic (congestive) heart failure: Secondary | ICD-10-CM | POA: Diagnosis not present

## 2017-11-02 DIAGNOSIS — I110 Hypertensive heart disease with heart failure: Secondary | ICD-10-CM | POA: Diagnosis not present

## 2017-11-02 DIAGNOSIS — R748 Abnormal levels of other serum enzymes: Secondary | ICD-10-CM | POA: Diagnosis not present

## 2017-11-02 HISTORY — DX: Type 2 diabetes mellitus without complications: E11.9

## 2017-11-02 HISTORY — DX: Heart failure, unspecified: I50.9

## 2017-11-02 HISTORY — DX: Essential (primary) hypertension: I10

## 2017-11-02 LAB — BRAIN NATRIURETIC PEPTIDE: B Natriuretic Peptide: 509 pg/mL — ABNORMAL HIGH (ref 0.0–100.0)

## 2017-11-02 LAB — CBC
HCT: 31.6 % — ABNORMAL LOW (ref 40.0–52.0)
Hemoglobin: 10.2 g/dL — ABNORMAL LOW (ref 13.0–18.0)
MCH: 30.3 pg (ref 26.0–34.0)
MCHC: 32.5 g/dL (ref 32.0–36.0)
MCV: 93.3 fL (ref 80.0–100.0)
PLATELETS: 87 10*3/uL — AB (ref 150–440)
RBC: 3.38 MIL/uL — ABNORMAL LOW (ref 4.40–5.90)
RDW: 13.6 % (ref 11.5–14.5)
WBC: 6.2 10*3/uL (ref 3.8–10.6)

## 2017-11-02 LAB — TROPONIN I
TROPONIN I: 0.94 ng/mL — AB (ref <0.03)
TROPONIN I: 0.79 ng/mL — AB (ref <0.03)
Troponin I: 0.85 ng/mL (ref <0.03)

## 2017-11-02 LAB — BASIC METABOLIC PANEL
Anion gap: 10 (ref 5–15)
BUN: 20 mg/dL (ref 6–20)
CHLORIDE: 111 mmol/L (ref 101–111)
CO2: 20 mmol/L — AB (ref 22–32)
CREATININE: 1.86 mg/dL — AB (ref 0.61–1.24)
Calcium: 8.9 mg/dL (ref 8.9–10.3)
GFR calc non Af Amer: 31 mL/min — ABNORMAL LOW (ref >60)
GFR, EST AFRICAN AMERICAN: 36 mL/min — AB (ref >60)
GLUCOSE: 178 mg/dL — AB (ref 65–99)
Potassium: 3.6 mmol/L (ref 3.5–5.1)
Sodium: 141 mmol/L (ref 135–145)

## 2017-11-02 LAB — CREATININE, SERUM
Creatinine, Ser: 1.68 mg/dL — ABNORMAL HIGH (ref 0.61–1.24)
GFR calc non Af Amer: 35 mL/min — ABNORMAL LOW (ref >60)
GFR, EST AFRICAN AMERICAN: 41 mL/min — AB (ref >60)

## 2017-11-02 LAB — GLUCOSE, CAPILLARY
Glucose-Capillary: 177 mg/dL — ABNORMAL HIGH (ref 65–99)
Glucose-Capillary: 186 mg/dL — ABNORMAL HIGH (ref 65–99)
Glucose-Capillary: 162 mg/dL — ABNORMAL HIGH (ref 65–99)

## 2017-11-02 LAB — APTT: aPTT: 33 seconds (ref 24–36)

## 2017-11-02 LAB — HEPARIN LEVEL (UNFRACTIONATED): Heparin Unfractionated: 0.79 IU/mL — ABNORMAL HIGH (ref 0.30–0.70)

## 2017-11-02 LAB — PROTIME-INR
INR: 1.27
Prothrombin Time: 15.8 seconds — ABNORMAL HIGH (ref 11.4–15.2)

## 2017-11-02 LAB — MAGNESIUM: Magnesium: 1.4 mg/dL — ABNORMAL LOW (ref 1.7–2.4)

## 2017-11-02 MED ORDER — ONDANSETRON HCL 4 MG PO TABS: 4.0000 mg | ORAL_TABLET | Freq: Four times a day (QID) | ORAL | Status: DC | PRN

## 2017-11-02 MED ORDER — POTASSIUM CHLORIDE CRYS ER 10 MEQ PO TBCR
20.0000 meq | EXTENDED_RELEASE_TABLET | Freq: Every day | ORAL | Status: DC
  Administered 2017-11-02 – 2017-11-04 (×3): 20 meq via ORAL
  Filled 2017-11-02 (×3): qty 2

## 2017-11-02 MED ORDER — HEPARIN (PORCINE) IN NACL 100-0.45 UNIT/ML-% IJ SOLN
1100.0000 [IU]/h | INTRAMUSCULAR | Status: DC
  Administered 2017-11-02: 1200 [IU]/h via INTRAVENOUS
  Administered 2017-11-03: 1100 [IU]/h via INTRAVENOUS
  Filled 2017-11-02 (×2): qty 250

## 2017-11-02 MED ORDER — INSULIN ASPART 100 UNIT/ML ~~LOC~~ SOLN
0.0000 [IU] | Freq: Three times a day (TID) | SUBCUTANEOUS | Status: DC
  Administered 2017-11-02 – 2017-11-03 (×2): 2 [IU] via SUBCUTANEOUS
  Administered 2017-11-03: 3 [IU] via SUBCUTANEOUS
  Administered 2017-11-03 – 2017-11-04 (×3): 2 [IU] via SUBCUTANEOUS
  Filled 2017-11-02 (×6): qty 1

## 2017-11-02 MED ORDER — ACETAMINOPHEN 650 MG RE SUPP: 650.0000 mg | Freq: Four times a day (QID) | RECTAL | Status: DC | PRN

## 2017-11-02 MED ORDER — ASPIRIN 81 MG PO CHEW
324.0000 mg | CHEWABLE_TABLET | Freq: Once | ORAL | Status: AC
  Administered 2017-11-02: 324 mg via ORAL
  Filled 2017-11-02: qty 4

## 2017-11-02 MED ORDER — BISACODYL 5 MG PO TBEC
5.0000 mg | DELAYED_RELEASE_TABLET | Freq: Every day | ORAL | Status: DC | PRN
  Administered 2017-11-04: 5 mg via ORAL
  Filled 2017-11-02: qty 1

## 2017-11-02 MED ORDER — SODIUM CHLORIDE 0.9% FLUSH
3.0000 mL | Freq: Two times a day (BID) | INTRAVENOUS | Status: DC
  Administered 2017-11-02 – 2017-11-04 (×5): 3 mL via INTRAVENOUS

## 2017-11-02 MED ORDER — CAPTOPRIL 25 MG PO TABS
12.5000 mg | ORAL_TABLET | Freq: Two times a day (BID) | ORAL | Status: DC
  Administered 2017-11-02 – 2017-11-04 (×5): 12.5 mg via ORAL
  Filled 2017-11-02 (×6): qty 0.5

## 2017-11-02 MED ORDER — HYDROCODONE-ACETAMINOPHEN 5-325 MG PO TABS: 1.0000 | ORAL_TABLET | ORAL | Status: DC | PRN

## 2017-11-02 MED ORDER — HEPARIN BOLUS VIA INFUSION
4000.0000 [IU] | Freq: Once | INTRAVENOUS | Status: AC
  Administered 2017-11-02: 4000 [IU] via INTRAVENOUS
  Filled 2017-11-02: qty 4000

## 2017-11-02 MED ORDER — SODIUM CHLORIDE 0.9 % IV SOLN: 250.0000 mL | INTRAVENOUS | Status: DC | PRN

## 2017-11-02 MED ORDER — SODIUM CHLORIDE 0.9% FLUSH: 3.0000 mL | INTRAVENOUS | Status: DC | PRN

## 2017-11-02 MED ORDER — ALBUTEROL SULFATE (2.5 MG/3ML) 0.083% IN NEBU
2.5000 mg | INHALATION_SOLUTION | RESPIRATORY_TRACT | Status: DC | PRN
  Administered 2017-11-02 – 2017-11-03 (×2): 2.5 mg via RESPIRATORY_TRACT
  Filled 2017-11-02 (×2): qty 3

## 2017-11-02 MED ORDER — PANTOPRAZOLE SODIUM 40 MG PO TBEC
40.0000 mg | DELAYED_RELEASE_TABLET | Freq: Every day | ORAL | Status: DC
  Administered 2017-11-02 – 2017-11-04 (×3): 40 mg via ORAL
  Filled 2017-11-02 (×3): qty 1

## 2017-11-02 MED ORDER — TERAZOSIN HCL 2 MG PO CAPS
2.0000 mg | ORAL_CAPSULE | Freq: Every day | ORAL | Status: DC
  Administered 2017-11-02 – 2017-11-04 (×3): 2 mg via ORAL
  Filled 2017-11-02 (×3): qty 1

## 2017-11-02 MED ORDER — FUROSEMIDE 10 MG/ML IJ SOLN
20.0000 mg | Freq: Two times a day (BID) | INTRAMUSCULAR | Status: DC
  Administered 2017-11-02 – 2017-11-03 (×2): 20 mg via INTRAVENOUS
  Filled 2017-11-02 (×2): qty 2

## 2017-11-02 MED ORDER — ROSUVASTATIN CALCIUM 10 MG PO TABS
20.0000 mg | ORAL_TABLET | Freq: Every day | ORAL | Status: DC
  Administered 2017-11-02 – 2017-11-04 (×3): 20 mg via ORAL
  Filled 2017-11-02 (×3): qty 2

## 2017-11-02 MED ORDER — DILTIAZEM HCL ER COATED BEADS 180 MG PO CP24
300.0000 mg | ORAL_CAPSULE | Freq: Every day | ORAL | Status: DC
  Administered 2017-11-02 – 2017-11-04 (×3): 300 mg via ORAL
  Filled 2017-11-02 (×3): qty 1

## 2017-11-02 MED ORDER — FUROSEMIDE 10 MG/ML IJ SOLN
20.0000 mg | Freq: Once | INTRAMUSCULAR | Status: AC
  Administered 2017-11-02: 20 mg via INTRAVENOUS
  Filled 2017-11-02: qty 4

## 2017-11-02 MED ORDER — SENNOSIDES-DOCUSATE SODIUM 8.6-50 MG PO TABS
1.0000 | ORAL_TABLET | Freq: Every evening | ORAL | Status: DC | PRN
  Administered 2017-11-03: 1 via ORAL
  Filled 2017-11-02: qty 1

## 2017-11-02 MED ORDER — ACETAMINOPHEN 325 MG PO TABS: 650.0000 mg | ORAL_TABLET | Freq: Four times a day (QID) | ORAL | Status: DC | PRN

## 2017-11-02 MED ORDER — MAGNESIUM SULFATE 2 GM/50ML IV SOLN
2.0000 g | Freq: Once | INTRAVENOUS | Status: AC
  Administered 2017-11-02: 2 g via INTRAVENOUS
  Filled 2017-11-02: qty 50

## 2017-11-02 MED ORDER — ONDANSETRON HCL 4 MG/2ML IJ SOLN: 4.0000 mg | Freq: Four times a day (QID) | INTRAMUSCULAR | Status: DC | PRN

## 2017-11-02 MED ORDER — INSULIN ASPART 100 UNIT/ML ~~LOC~~ SOLN: 0.0000 [IU] | Freq: Every day | SUBCUTANEOUS | Status: DC

## 2017-11-02 NOTE — ED Notes (Signed)
Nurse attempted to call report. Waiting for floor to call back.

## 2017-11-02 NOTE — ED Notes (Signed)
Pt stating that he has had SOB for the last two days. Pt stating that he is not SOB at this time. Pt stating that the SOB is worse with ambulation. Pt stating cough that is productive at times with clear/white phlegm. Pt denying any CP or fevers at home. Pt in NAD at this time.

## 2017-11-02 NOTE — ED Triage Notes (Addendum)
Pt c/o increased SOB with abd swelling/distention for the past 2-3 days/. Denies any pain at present. Pt is in NAD. States his PCP took him off of his fluid pill about 2 months ago.

## 2017-11-02 NOTE — Consult Note (Addendum)
ANTICOAGULATION CONSULT NOTE - Initial Consult  Pharmacy Consult for heparin drip Indication: elevated troponin  No Known Allergies  Patient Measurements: Height: 6\' 3"  (190.5 cm) Weight: 214 lb (97.1 kg) IBW/kg (Calculated) : 84.5 Heparin Dosing Weight: 97.1kg  Vital Signs: Temp: 97.5 F (36.4 C) (12/13 1422) Temp Source: Oral (12/13 1422) BP: 148/63 (12/13 1422) Pulse Rate: 47 (12/13 1422)  Labs: Recent Labs    11/02/17 0944  HGB 10.2*  HCT 31.6*  PLT 87*  CREATININE 1.86*  TROPONINI 0.94*    Estimated Creatinine Clearance: 34.7 mL/min (A) (by C-G formula based on SCr of 1.86 mg/dL (H)).   Medical History: Past Medical History:  Diagnosis Date  . CHF (congestive heart failure) (HCC)   . Diabetes mellitus without complication (HCC)   . Hypertension     Medications:  Scheduled:  . captopril  12.5 mg Oral BID  . diltiazem  300 mg Oral Daily  . furosemide  20 mg Intravenous Q12H  . heparin  4,000 Units Intravenous Once  . insulin aspart  0-5 Units Subcutaneous QHS  . insulin aspart  0-9 Units Subcutaneous TID WC  . pantoprazole  40 mg Oral Daily  . potassium chloride  20 mEq Oral Daily  . rosuvastatin  20 mg Oral Daily  . sodium chloride flush  3 mL Intravenous Q12H  . terazosin  2 mg Oral Daily    Assessment: Patient is a 83 year old male with a PMH of HTN, CAD, HLD, AAA and DM. Pt presents with an elevated troponin. Pharmacy consulted to dose heparin drip. Pt also with elevated BNP, swelling. Denies chest pain. Add on INR and APTT ordered. No anticoagulants on med rec. Baseline plt =87. Monitor closely  Goal of Therapy:  Heparin level 0.3-0.7 units/ml Monitor platelets by anticoagulation protocol: Yes   Plan:  Give 4000 units bolus x 1 Start heparin infusion at 1200 units/hr Check anti-Xa level in 8 hours and daily while on heparin Continue to monitor H&H and platelets  Melissa D Maccia, Pharm.D, BCPS Clinical Pharmacist  11/02/2017,2:29 PM     12/13 2300 heparin level 0.79. Decrease rate to 1100 units/hr and recheck heparin level in 8 hours.  Fulton ReekMatt Willadeen Colantuono, PharmD, BCPS  11/02/17 11:35 PM

## 2017-11-02 NOTE — ED Notes (Signed)
Pt has been given pillow. Pt in NAD at this time. Pt given urinal and instructed to push call light if he needs assistance.

## 2017-11-02 NOTE — Consult Note (Signed)
[facility]  Patient ID: Aaron Rivera H Crenshaw, MRN: [id number], DOB/AGE: [birth year] 83 y.o. Admit date: 11/02/2017   Date of Consult: 11/02/2017 Primary Physician: Maple HudsonGilbert, Richard L Jr., MD Primary Cardiologist: None  Chief Complaint:  Chief Complaint  Patient presents with  . Shortness of Breath   Reason for Consult: Ingestive heart failure  HPI: 83 y.o. male with known diabetes with complication chronic kidney disease stage III anemia and coronary artery disease for which the patient has been on appropriate medication management for quite some time.  The patient has had new onset of lower extremity edema and shortness of breath PND and orthopnea over the last 2 days.  The significant weight gain has caused him to be severely short of breath and come to the hospital.  He did have a chest x-ray consistent with bilateral pulmonary edema and an EKG showing normal sinus rhythm with ventricular bigeminy.  Additionally he has continued chronic kidney disease anemia and a BNP of 509.  He did not report any chest discomfort but does have an elevated troponin of 0.94 and 0.8.  This may be consistent with demand ischemia versus non-ST elevation myocardial infarction.  Currently he has had significant improvements of his symptoms with IV diuresis and has concerns at this time  Past Medical History:  Diagnosis Date  . CHF (congestive heart failure) (HCC)   . Diabetes mellitus without complication (HCC)   . Hypertension       Surgical History:  Past Surgical History:  Procedure Laterality Date  . ABDOMINAL AORTIC ANEURYSM REPAIR       Home Meds: Prior to Admission medications   Medication Sig Start Date End Date Taking? Authorizing Provider  captopril (CAPOTEN) 25 MG tablet TAKE 1/2 TABLET BY MOUTH TWICE A DAY 01/10/17  Yes Maple HudsonGilbert, Richard L Jr., MD  diltiazem [facility](CARDIZEM CD) 300 MG 24 hr capsule TAKE 1 CAPSULE EVERY DAY 12/28/16  Yes Maple HudsonGilbert, Richard L Jr., MD   KLOR-CON 10 10 MEQ tablet TAKE 2 TABLETS EVERY DAY 10/11/17  Yes Maple HudsonGilbert, Richard L Jr., MD  omeprazole (PRILOSEC) 20 MG capsule Take 1 capsule (20 mg total) daily as needed by mouth. 09/27/17  Yes Maple HudsonGilbert, Richard L Jr., MD  ranitidine (ZANTAC) 150 MG tablet Take 150 mg by mouth at bedtime. 10/11/17  Yes [provider]  rosuvastatin (CRESTOR) 20 MG tablet Take 1 tablet (20 mg total) by mouth daily. 12/12/16  Yes Maple HudsonGilbert, Richard L Jr., MD  [facility] test strip USE 2 TIMES A DAY AS INSTRUCTED 02/27/17   Maple HudsonGilbert, Richard L Jr., MD  pioglitazone (ACTOS) 15 MG tablet TAKE 1 TABLET (15 MG TOTAL) BY MOUTH EVERY MORNING. Patient not taking: Reported on 11/02/2017 09/14/17   Maple HudsonGilbert, Richard L Jr., MD  terazosin (HYTRIN) 2 MG capsule TAKE 1 CAPSULE (2 MG TOTAL) BY MOUTH DAILY. Patient not taking: Reported on 11/02/2017 10/16/17   Maple HudsonGilbert, Richard L Jr., MD    Inpatient Medications:  . captopril  12.5 mg Oral BID  . diltiazem  300 mg Oral Daily  . furosemide  20 mg Intravenous Q12H  . insulin aspart  0-5 Units Subcutaneous QHS  . insulin aspart  0-9 Units Subcutaneous TID WC  . pantoprazole  40 mg Oral Daily  . potassium chloride  20 mEq Oral Daily  . rosuvastatin  20 mg Oral Daily  . sodium chloride flush  3 mL Intravenous Q12H  . terazosin  2 mg Oral Daily   . sodium chloride    .  heparin 1,200 Units/hr (11/02/17 1528)    Allergies: No Known Allergies  Social History   Socioeconomic History  . Marital status: Widowed    Spouse name: Not on file  . Number of children: Not on file  . Years of education: Not on file  . Highest education level: Not on file  Social Needs  . Financial resource strain: Not on file  . Food insecurity - worry: Not on file  . Food insecurity - inability: Not on file  . Transportation needs - medical: Not on file  . Transportation needs - non-medical: Not on file  Occupational History  . Not on file  Tobacco Use  . Smoking status: Former  Smoker    Types: Cigarettes  . Smokeless tobacco: Former NeurosurgeonUser    Quit date: 11/20/1969  Substance and Sexual Activity  . Alcohol use: No    Alcohol/week: 0.0 oz  . Drug use: No  . Sexual activity: No  Other Topics Concern  . Not on file  Social History Narrative  . Not on file     Family History  Problem Relation Age of Onset  . Heart disease Mother   . Breast cancer Sister   . Prostate cancer Brother   . Hypertension Brother   . Hypertension Brother      Review of Systems Positive for shortness of breath PND orthopnea Negative for: General:  chills, fever, night sweats or weight changes.  Cardiovascular: Positive for PND orthopnea negative for syncope dizziness  Dermatological skin lesions rashes Respiratory: Cough congestion Urologic: Frequent urination urination at night and hematuria Abdominal: negative for nausea, vomiting, diarrhea, bright red blood per rectum, melena, or hematemesis Neurologic: negative for visual changes, and/or hearing changes  All other systems reviewed and are otherwise negative except as noted above.  Labs: Recent Labs    11/02/17 0944 11/02/17 1432  TROPONINI 0.94* 0.85*   Lab Results  Component Value Date   WBC 6.2 11/02/2017   HGB 10.2 (L) 11/02/2017   HCT 31.6 (L) 11/02/2017   MCV 93.3 11/02/2017   PLT 87 (L) 11/02/2017    Recent Labs  Lab 11/02/17 0944 11/02/17 1432  NA 141  --   K 3.6  --   CL 111  --   CO2 20*  --   BUN 20  --   CREATININE 1.86* 1.68*  CALCIUM 8.9  --   GLUCOSE 178*  --    Lab Results  Component Value Date   CHOL 144 09/27/2017   HDL 31 (L) 09/27/2017   LDLCALC 111 (H) 03/27/2017   TRIG 84 09/27/2017   No results found for: DDIMER  Radiology/Studies:  Dg Chest 2 View  Result Date: 11/02/2017 CLINICAL DATA:  Shortness of Breath EXAM: CHEST  2 VIEW COMPARISON:  September 05, 2013 FINDINGS: There is fibrotic change throughout the lungs bilaterally, greatest in the right upper lobe and basilar  regions. Interstitium is somewhat more prominent than on prior CT suggests that there may be a superimposed degree of interstitial edema. There is a small right pleural effusion. There is cardiomegaly with pulmonary venous hypertension. No adenopathy. There is aortic atherosclerosis. There is degenerative change in each shoulder. IMPRESSION: Suspect a degree of interstitial edema superimposed on fibrosis. There is a small right pleural effusion with cardiomegaly and pulmonary venous hypertension. Suspect a degree of congestive heart failure. There is aortic atherosclerosis. No adenopathy appreciable. Aortic Atherosclerosis (ICD10-I70.0). Electronically Signed   By: Bretta BangWilliam  Woodruff III M.D.   On: 11/02/2017  10:11    EKG: Normal sinus rhythm with ventricular bigeminy  Weights: Filed Weights   11/02/17 0925  Weight: 97.1 kg (214 lb)     Physical Exam: Blood pressure (!) 148/63, pulse (!) 47, temperature (!) 97.5 F (36.4 C), temperature source Oral, resp. rate 17, height 6\' 3"  (1.905 m), weight 97.1 kg (214 lb), SpO2 97 %. Body mass index is 26.75 kg/m. General: Well developed, well nourished, in no acute distress. Head eyes ears nose throat: Normocephalic, atraumatic, sclera non-icteric, no xanthomas, nares are without discharge. No apparent thyromegaly and/or mass  Lungs: Normal respiratory effort.  You wheezes, bibasilar rales, no rhonchi.  Heart: Irregular with normal S1 S2. no murmur gallop, no rub, PMI is normal size and placement, carotid upstroke normal without bruit, jugular venous pressure is normal Abdomen: Soft, non-tender, non-distended with normoactive bowel sounds. No hepatomegaly. No rebound/guarding. No obvious abdominal masses. Abdominal aorta is normal size without bruit Extremities: 1+ edema. no cyanosis, no clubbing, no ulcers  Peripheral : 2+ bilateral upper extremity pulses, 2+ bilateral femoral pulses, 2+ bilateral dorsal pedal pulse Neuro: Alert and oriented. No facial  asymmetry. No focal deficit. Moves all extremities spontaneously. Musculoskeletal: Normal muscle tone without kyphosis Psych:  Responds to questions appropriately with a normal affect.    Assessment: 83 year old male with diabetes with complication chronic kidney disease anemia coronary artery disease with acute probable systolic dysfunction congestive heart failure with elevated troponin consistent with demand ischemia versus non-ST elevation myocardial infarction  Plan: 1.  Continue IV diuresis with furosemide 2.  Continue hypertension control and treatment of probable systolic dysfunction heart failure with ACE inhibitor and calcium channel blocker 3.  Aspirin for further risk reduction cardiovascular event 4.  Continue high intensity cholesterol therapy 5.  Echocardiogram for LV systolic dysfunction and valvular heart disease contributing and above and adjustments of medication thereafter 6.  Follow troponin and further evaluate possibility of consistency for non-ST elevation myocardial infarction versus demand ischemia  7.  Further treatment options after above  Signed, Lamar Blinks M.D. [facility] Cardiology 11/02/2017, 5:37 PM

## 2017-11-02 NOTE — ED Provider Notes (Signed)
[facility]: [facility] Emergency Department Provider Note  ____________________________________________  Time seen: Approximately 11:48 AM  I have reviewed the triage vital signs and the nursing notes.   HISTORY  Chief Complaint Shortness of Breath   HPI Aaron Rivera is a 83 y.o. male with h/o CAD, HTN, PAD, AAA who presents for evaluation of SOB. Patient reports 3 days of progressively worsening shortness of breath and swelling of his lower extremities. Patient denies having known history of CHF. He reports that the shortness of breath is worse with minimal exertion, constant, moderate at rest. He reports that he has been waking up several times at night feeling short of breath but denies orthopnea. No URI symptoms. No chest pain. Patient reports having history of a previous heart attack in the 4280s. He is not a smoker.  Past Medical History:  Diagnosis Date  . CHF (congestive heart failure) (HCC)   . Diabetes mellitus without complication (HCC)   . Hypertension     Patient Active Problem List   Diagnosis Date Noted  . Vitamin D deficiency, unspecified 08/26/2017  . PAD (peripheral artery disease) (HCC) 03/20/2017  . Carotid stenosis 03/20/2017  . AAA (abdominal aortic aneurysm) (HCC) 06/16/2015  . Allergic rhinitis 06/16/2015  . AI (aortic incompetence) 06/16/2015  . Benign prostatic hypertrophy with lower urinary tract symptoms (LUTS) 06/16/2015  . Coronary artery abnormality 06/16/2015  . ED (erectile dysfunction) of organic origin 06/16/2015  . Essential (primary) hypertension 06/16/2015  . Acid reflux 06/16/2015  . HLD (hyperlipidemia) 06/16/2015  . Arthritis, degenerative 06/16/2015  . Allergic rhinitis, seasonal 06/16/2015  . Diabetes mellitus, type 2 (HCC) 06/16/2015    Past Surgical History:  Procedure Laterality Date  . ABDOMINAL AORTIC ANEURYSM REPAIR      Prior to Admission medications   Medication Sig Start Date End Date Taking?  Authorizing Provider  captopril (CAPOTEN) 25 MG tablet TAKE 1/2 TABLET BY MOUTH TWICE A DAY 01/10/17   Maple HudsonGilbert, Richard L Jr., MD  diltiazem [facility](CARDIZEM CD) 300 MG 24 hr capsule TAKE 1 CAPSULE EVERY DAY 12/28/16   Maple HudsonGilbert, Richard L Jr., MD  KLOR-CON 10 10 MEQ tablet TAKE 2 TABLETS EVERY DAY 10/11/17   Maple HudsonGilbert, Richard L Jr., MD  omeprazole (PRILOSEC) 20 MG capsule Take 1 capsule (20 mg total) daily as needed by mouth. 09/27/17   Maple HudsonGilbert, Richard L Jr., MD  [facility] test strip USE 2 TIMES A DAY AS INSTRUCTED 02/27/17   Maple HudsonGilbert, Richard L Jr., MD  pioglitazone (ACTOS) 15 MG tablet TAKE 1 TABLET (15 MG TOTAL) BY MOUTH EVERY MORNING. 09/14/17   Maple HudsonGilbert, Richard L Jr., MD  ranitidine (ZANTAC) 150 MG tablet Take 150 mg by mouth at bedtime. 10/11/17   [provider]  rosuvastatin (CRESTOR) 20 MG tablet Take 1 tablet (20 mg total) by mouth daily. 12/12/16   Maple HudsonGilbert, Richard L Jr., MD  terazosin (HYTRIN) 2 MG capsule TAKE 1 CAPSULE (2 MG TOTAL) BY MOUTH DAILY. 10/16/17   Maple HudsonGilbert, Richard L Jr., MD  XIIDRA 5 % SOLN Apply 1 drop to eye daily. 10/16/17   [provider]    Allergies Patient has no known allergies.  Family History  Problem Relation Age of Onset  . Heart disease Mother   . Breast cancer Sister   . Prostate cancer Brother   . Hypertension Brother   . Hypertension Brother     Social History Social History   Tobacco Use  . Smoking status: Former Smoker    Types:  Cigarettes  . Smokeless tobacco: Former Neurosurgeon    Quit date: 11/20/1969  Substance Use Topics  . Alcohol use: No    Alcohol/week: 0.0 oz  . Drug use: No    Review of Systems  Constitutional: Negative for fever. Eyes: Negative for visual changes. ENT: Negative for sore throat. Neck: No neck pain  Cardiovascular: Negative for chest pain. Respiratory: + shortness of breath. Gastrointestinal: Negative for abdominal pain, vomiting or diarrhea. Genitourinary: Negative for dysuria. Musculoskeletal:  Negative for back pain. + b/l LE edema Skin: Negative for rash. Neurological: Negative for headaches, weakness or numbness. Psych: No SI or HI  ____________________________________________   PHYSICAL EXAM:  VITAL SIGNS: ED Triage Vitals  Enc Vitals Group     BP 11/02/17 0924 (!) 135/51     Pulse Rate 11/02/17 0924 (!) 51     Resp 11/02/17 0924 18     Temp 11/02/17 0924 97.7 F (36.5 C)     Temp Source 11/02/17 0924 Oral     SpO2 11/02/17 0924 97 %     Weight 11/02/17 0925 214 lb (97.1 kg)     Height 11/02/17 0925 6\' 3"  (1.905 m)     Head Circumference --      Peak Flow --      Pain Score 11/02/17 0942 0     Pain Loc --      Pain Edu? --      Excl. in GC? --     Constitutional: Alert and oriented. Well appearing and in no apparent distress. HEENT:      Head: Normocephalic and atraumatic.         Eyes: Conjunctivae are normal. Sclera is non-icteric.       Mouth/Throat: Mucous membranes are moist.       Neck: Supple with no signs of meningismus. Cardiovascular: Irregular rhythm. No murmurs, gallops, or rubs. 2+ symmetrical distal pulses are present in all extremities. No JVD. Respiratory: Normal respiratory effort. Lungs are clear to auscultation bilaterally, decreased breath sounds in the R base.  Gastrointestinal: Soft, non tender, and non distended with positive bowel sounds. No rebound or guarding. Musculoskeletal: 2+ pitting edema bilateral lower extremities  Neurologic: Normal speech and language. Face is symmetric. Moving all extremities. No gross focal neurologic deficits are appreciated. Skin: Skin is warm, dry and intact. No rash noted. Psychiatric: Mood and affect are normal. Speech and behavior are normal.  ____________________________________________   LABS (all labs ordered are listed, but only abnormal results are displayed)  Labs Reviewed  BASIC METABOLIC PANEL - Abnormal; Notable for the following components:      Result Value   CO2 20 (*)     Glucose, Bld 178 (*)    Creatinine, Ser 1.86 (*)    GFR calc non Af Amer 31 (*)    GFR calc Af Amer 36 (*)    All other components within normal limits  CBC - Abnormal; Notable for the following components:   RBC 3.38 (*)    Hemoglobin 10.2 (*)    HCT 31.6 (*)    Platelets 87 (*)    All other components within normal limits  TROPONIN I - Abnormal; Notable for the following components:   Troponin I 0.94 (*)    All other components within normal limits  MAGNESIUM  BRAIN NATRIURETIC PEPTIDE   ____________________________________________  EKG  ED ECG REPORT I, Nita Sickle, the attending physician, personally viewed and interpreted this ECG.  Sinus tachycardia with frequent PVCs, rate of 106,  normal intervals, normal axis, no ST elevations or depressions. No prior for comparison  ____________________________________________  RADIOLOGY  CXR: Suspect a degree of interstitial edema superimposed on fibrosis. There is a small right pleural effusion with cardiomegaly and pulmonary venous hypertension. Suspect a degree of congestive heart failure. There is aortic atherosclerosis. No adenopathy appreciable.  Aortic Atherosclerosis (ICD10-I70.0). ____________________________________________   PROCEDURES  Procedure(s) performed: None Procedures Critical Care performed: yes  CRITICAL CARE Performed by: Nita Sicklearolina Orissa Arreaga  ?  Total critical care time: 40 min  Critical care time was exclusive of separately billable procedures and treating other patients.  Critical care was necessary to treat or prevent imminent or life-threatening deterioration.  Critical care was time spent personally by me on the following activities: development of treatment plan with patient and/or surrogate as well as nursing, discussions with consultants, evaluation of patient's response to treatment, examination of patient, obtaining history from patient or surrogate, ordering and performing  treatments and interventions, ordering and review of laboratory studies, ordering and review of radiographic studies, pulse oximetry and re-evaluation of patient's condition.  ____________________________________________   INITIAL IMPRESSION / ASSESSMENT AND PLAN / ED COURSE   83 y.o. male with h/o CAD, HTN, PAD, AAA who presents for evaluation of SOB. Patient is volume overloaded on exam with 2+ pitting edema and elevated JVD. EKG with bigeminy PVCs but no evidence of ischemia. Troponin is elevated at 0.94, chest x-ray concerning for cardiomegaly, pleural effusion, and pulmonary edema. A presentation concerning for NSTEMI and CHF exacerbation. Will start patient on ASA and lasix and admit to hospitalist      As part of my medical decision making, I reviewed the following data within the electronic MEDICAL RECORD NUMBER Nursing notes reviewed and incorporated, Labs reviewed , EKG interpreted , Radiograph reviewed , Discussed with admitting physician , Notes from prior ED visits and [place] Controlled Substance Database    Pertinent labs & imaging results that were available during my care of the patient were reviewed by me and considered in my medical decision making (see chart for details).    ____________________________________________   FINAL CLINICAL IMPRESSION(S) / ED DIAGNOSES  Final diagnoses:  Acute on chronic congestive heart failure, unspecified heart failure type (HCC)  NSTEMI (non-ST elevated myocardial infarction) (HCC)      NEW MEDICATIONS STARTED DURING THIS VISIT:  ED Discharge Orders    None       Note:  This document was prepared using Dragon voice recognition software and may include unintentional dictation errors.    Nita SickleVeronese, [place], MD 11/02/17 [phone]

## 2017-11-02 NOTE — ED Notes (Signed)
Nurse assisted pt with urinal. Pt tolerated standing well with assist. Pt updated that he could eat and that nurse would be attempting to call report to the floor.

## 2017-11-02 NOTE — H&P (Signed)
[facility] [place] at [facility]   PATIENT NAME: Aaron Rivera    MR#:  [id number]  DATE OF BIRTH:  02-Nov-1930  DATE OF ADMISSION:  11/02/2017  PRIMARY CARE PHYSICIAN: Maple HudsonGilbert, Richard L Jr., MD   REQUESTING/REFERRING PHYSICIAN: Nita SickleVeronese, [place], MD  CHIEF COMPLAINT:   Chief Complaint  Patient presents with  . Shortness of Breath   Cough and shortness of breath for 3 days. HISTORY OF PRESENT ILLNESS:  Aaron Rivera  is a 83 y.o. male with a known history of hypertension, CAD, hyperlipidemia, AAA, and diabetes.  The patient presented to ED with above chief complaints.  He also complains of bilateral leg swelling.  He denies any fever or chills, no chest pain no palpitation.  The patient also has osteopenia and nocturnal dyspnea.  Chest x-ray show pulmonary edema.  Troponin is elevated at 0.94.  PAST MEDICAL HISTORY:   Past Medical History:  Diagnosis Date  . CHF (congestive heart failure) (HCC)   . Diabetes mellitus without complication (HCC)   . Hypertension     PAST SURGICAL HISTORY:   Past Surgical History:  Procedure Laterality Date  . ABDOMINAL AORTIC ANEURYSM REPAIR      SOCIAL HISTORY:   Social History   Tobacco Use  . Smoking status: Former Smoker    Types: Cigarettes  . Smokeless tobacco: Former NeurosurgeonUser    Quit date: 11/20/1969  Substance Use Topics  . Alcohol use: No    Alcohol/week: 0.0 oz    FAMILY HISTORY:   Family History  Problem Relation Age of Onset  . Heart disease Mother   . Breast cancer Sister   . Prostate cancer Brother   . Hypertension Brother   . Hypertension Brother     DRUG ALLERGIES:  No Known Allergies  REVIEW OF SYSTEMS:   Review of Systems  Constitutional: Positive for malaise/fatigue. Negative for chills and fever.  HENT: Negative for sore throat.   Eyes: Negative for blurred vision and double vision.  Respiratory: Positive for cough, sputum production and shortness of breath. Negative for  hemoptysis, wheezing and stridor.   Cardiovascular: Positive for leg swelling. Negative for chest pain, palpitations and orthopnea.  Gastrointestinal: Negative for abdominal pain, blood in stool, diarrhea, melena, nausea and vomiting.  Genitourinary: Negative for dysuria, flank pain and hematuria.  Musculoskeletal: Negative for back pain and joint pain.  Neurological: Positive for weakness. Negative for dizziness, sensory change, focal weakness, seizures, loss of consciousness and headaches.  Endo/Heme/Allergies: Negative for polydipsia.  Psychiatric/Behavioral: Negative for depression. The patient is not nervous/anxious.     MEDICATIONS AT HOME:   Prior to Admission medications   Medication Sig Start Date End Date Taking? Authorizing Provider  captopril (CAPOTEN) 25 MG tablet TAKE 1/2 TABLET BY MOUTH TWICE A DAY 01/10/17  Yes Maple HudsonGilbert, Richard L Jr., MD  diltiazem [facility](CARDIZEM CD) 300 MG 24 hr capsule TAKE 1 CAPSULE EVERY DAY 12/28/16  Yes Maple HudsonGilbert, Richard L Jr., MD  KLOR-CON 10 10 MEQ tablet TAKE 2 TABLETS EVERY DAY 10/11/17  Yes Maple HudsonGilbert, Richard L Jr., MD  omeprazole (PRILOSEC) 20 MG capsule Take 1 capsule (20 mg total) daily as needed by mouth. 09/27/17  Yes Maple HudsonGilbert, Richard L Jr., MD  ranitidine (ZANTAC) 150 MG tablet Take 150 mg by mouth at bedtime. 10/11/17  Yes [provider]  rosuvastatin (CRESTOR) 20 MG tablet Take 1 tablet (20 mg total) by mouth daily. 12/12/16  Yes Maple HudsonGilbert, Richard L Jr., MD  [facility] test strip USE  2 TIMES A DAY AS INSTRUCTED 02/27/17   Maple HudsonGilbert, Richard L Jr., MD  pioglitazone (ACTOS) 15 MG tablet TAKE 1 TABLET (15 MG TOTAL) BY MOUTH EVERY MORNING. Patient not taking: Reported on 11/02/2017 09/14/17   Maple HudsonGilbert, Richard L Jr., MD  terazosin (HYTRIN) 2 MG capsule TAKE 1 CAPSULE (2 MG TOTAL) BY MOUTH DAILY. Patient not taking: Reported on 11/02/2017 10/16/17   Maple HudsonGilbert, Richard L Jr., MD      VITAL SIGNS:  Blood pressure 139/77, pulse (!) 102, temperature  97.7 F (36.5 C), temperature source Oral, resp. rate 17, height 6\' 3"  (1.905 m), weight 214 lb (97.1 kg), SpO2 94 %.  PHYSICAL EXAMINATION:  Physical Exam  GENERAL:  83 y.o.-year-old patient lying in the bed with no acute distress.  EYES: Pupils equal, round, reactive to light and accommodation. No scleral icterus. Extraocular muscles intact.  HEENT: Head atraumatic, normocephalic. Oropharynx and nasopharynx clear.  NECK:  Supple, no jugular venous distention. No thyroid enlargement, no tenderness.  LUNGS: Normal breath sounds bilaterally, no wheezing, bilateral basilar rales, no rhonchi or crepitation. No use of accessory muscles of respiration.  CARDIOVASCULAR: S1, S2 normal. No murmurs, rubs, or gallops.  ABDOMEN: Soft, nontender, nondistended. Bowel sounds present. No organomegaly or mass.  EXTREMITIES: No cyanosis, or clubbing.  Bilateral leg edema 2+. NEUROLOGIC: Cranial nerves II through XII are intact. Muscle strength 5/5 in all extremities. Sensation intact. Gait not checked.  PSYCHIATRIC: The patient is alert and oriented x 3.  SKIN: No obvious rash, lesion, or ulcer.   LABORATORY PANEL:   CBC Recent Labs  Lab 11/02/17 0944  WBC 6.2  HGB 10.2*  HCT 31.6*  PLT 87*   ------------------------------------------------------------------------------------------------------------------  Chemistries  Recent Labs  Lab 11/02/17 0944  NA 141  K 3.6  CL 111  CO2 20*  GLUCOSE 178*  BUN 20  CREATININE 1.86*  CALCIUM 8.9  MG 1.4*   ------------------------------------------------------------------------------------------------------------------  Cardiac Enzymes Recent Labs  Lab 11/02/17 0944  TROPONINI 0.94*   ------------------------------------------------------------------------------------------------------------------  RADIOLOGY:  Dg Chest 2 View  Result Date: 11/02/2017 CLINICAL DATA:  Shortness of Breath EXAM: CHEST  2 VIEW COMPARISON:  September 05, 2013  FINDINGS: There is fibrotic change throughout the lungs bilaterally, greatest in the right upper lobe and basilar regions. Interstitium is somewhat more prominent than on prior CT suggests that there may be a superimposed degree of interstitial edema. There is a small right pleural effusion. There is cardiomegaly with pulmonary venous hypertension. No adenopathy. There is aortic atherosclerosis. There is degenerative change in each shoulder. IMPRESSION: Suspect a degree of interstitial edema superimposed on fibrosis. There is a small right pleural effusion with cardiomegaly and pulmonary venous hypertension. Suspect a degree of congestive heart failure. There is aortic atherosclerosis. No adenopathy appreciable. Aortic Atherosclerosis (ICD10-I70.0). Electronically Signed   By: Bretta BangWilliam  Woodruff III M.D.   On: 11/02/2017 10:11      IMPRESSION AND PLAN:   Acute CHF, unknown type The patient will be admitted to telemetry floor.  Start CHF protocol. Lasix IV twice daily, echocardiogram and cardiology consult.  Elevated troponin, possible due to demanding ischemia. Start aspirin and continue statin.  Follow-up troponin level.  Start heparin drip and cardiology consult.  Hypomagnesemia.  Give IV magnesium in the follow-up level.  CAD.  Treatment as above.  Diabetes.  Start a sliding scale, hold Actos.  Hypertension.  Continue home hypertension medication.  CKD stage III.  Follow-up BMP while on Lasix.  Anemia of chronic disease.  Stable.  All the records are reviewed and case discussed with ED provider. Management plans discussed with the patient, his son and they are in agreement.  CODE STATUS: Full code  TOTAL TIME TAKING CARE OF THIS PATIENT: 58 minutes.    Shaune Pollack M.D on 11/02/2017 at 12:30 PM  Between 7am to 6pm - Pager - [phone]  After 6pm go to [email or web address] - password [email or web address] [facility]  [facility] [place] [facility]  Office  [phone]  CC: Primary care  physician; Maple Hudson., MD   Note: This dictation was prepared with Dragon dictation along with smaller phrase technology. Any transcriptional errors that result from this process are unin

## 2017-11-03 ENCOUNTER — Inpatient Hospital Stay
Admit: 2017-11-03 | Discharge: 2017-11-03 | Disposition: A | Discharge status: Home / Self Care | Payer: MEDICARE | Attending: Internal Medicine | Admitting: Internal Medicine

## 2017-11-03 DIAGNOSIS — E119 Type 2 diabetes mellitus without complications: Secondary | ICD-10-CM | POA: Diagnosis not present

## 2017-11-03 DIAGNOSIS — R748 Abnormal levels of other serum enzymes: Secondary | ICD-10-CM | POA: Diagnosis not present

## 2017-11-03 DIAGNOSIS — I5021 Acute systolic (congestive) heart failure: Secondary | ICD-10-CM | POA: Diagnosis not present

## 2017-11-03 DIAGNOSIS — I509 Heart failure, unspecified: Secondary | ICD-10-CM | POA: Diagnosis not present

## 2017-11-03 DIAGNOSIS — I10 Essential (primary) hypertension: Secondary | ICD-10-CM | POA: Diagnosis not present

## 2017-11-03 LAB — GLUCOSE, CAPILLARY
GLUCOSE-CAPILLARY: 175 mg/dL — AB (ref 65–99)
GLUCOSE-CAPILLARY: 166 mg/dL — AB (ref 65–99)
Glucose-Capillary: 204 mg/dL — ABNORMAL HIGH (ref 65–99)
Glucose-Capillary: 174 mg/dL — ABNORMAL HIGH (ref 65–99)

## 2017-11-03 LAB — CBC
HEMATOCRIT: 32.4 % — AB (ref 40.0–52.0)
Hemoglobin: 10.5 g/dL — ABNORMAL LOW (ref 13.0–18.0)
MCH: 30.3 pg (ref 26.0–34.0)
MCHC: 32.3 g/dL (ref 32.0–36.0)
MCV: 93.8 fL (ref 80.0–100.0)
Platelets: 94 10*3/uL — ABNORMAL LOW (ref 150–440)
RBC: 3.46 MIL/uL — AB (ref 4.40–5.90)
RDW: 13.7 % (ref 11.5–14.5)
WBC: 8.4 10*3/uL (ref 3.8–10.6)

## 2017-11-03 LAB — BASIC METABOLIC PANEL
Anion gap: 11 (ref 5–15)
BUN: 23 mg/dL — AB (ref 6–20)
CHLORIDE: 111 mmol/L (ref 101–111)
CO2: 20 mmol/L — ABNORMAL LOW (ref 22–32)
Calcium: 8.9 mg/dL (ref 8.9–10.3)
Creatinine, Ser: 1.73 mg/dL — ABNORMAL HIGH (ref 0.61–1.24)
GFR calc non Af Amer: 34 mL/min — ABNORMAL LOW (ref >60)
GFR, EST AFRICAN AMERICAN: 40 mL/min — AB (ref >60)
Glucose, Bld: 192 mg/dL — ABNORMAL HIGH (ref 65–99)
POTASSIUM: 3.6 mmol/L (ref 3.5–5.1)
SODIUM: 142 mmol/L (ref 135–145)

## 2017-11-03 LAB — HEPARIN LEVEL (UNFRACTIONATED): Heparin Unfractionated: 0.75 IU/mL — ABNORMAL HIGH (ref 0.30–0.70)

## 2017-11-03 LAB — MAGNESIUM: Magnesium: 1.8 mg/dL (ref 1.7–2.4)

## 2017-11-03 MED ORDER — ENOXAPARIN SODIUM 40 MG/0.4ML ~~LOC~~ SOLN
40.0000 mg | SUBCUTANEOUS | Status: DC
  Administered 2017-11-03: 40 mg via SUBCUTANEOUS
  Filled 2017-11-03: qty 0.4

## 2017-11-03 MED ORDER — FUROSEMIDE 10 MG/ML IJ SOLN
40.0000 mg | Freq: Two times a day (BID) | INTRAMUSCULAR | Status: DC
  Administered 2017-11-03 – 2017-11-04 (×2): 40 mg via INTRAVENOUS
  Filled 2017-11-03 (×2): qty 4

## 2017-11-03 MED ORDER — PREMIER PROTEIN SHAKE
11.0000 [oz_av] | Freq: Two times a day (BID) | ORAL | Status: DC
  Administered 2017-11-03 – 2017-11-04 (×2): 11 [oz_av] via ORAL

## 2017-11-03 MED ORDER — ENSURE ENLIVE PO LIQD
237.0000 mL | Freq: Two times a day (BID) | ORAL | Status: DC
Start: 2017-11-03 — End: 2017-11-03
  Administered 2017-11-03: 237 mL via ORAL

## 2017-11-03 NOTE — Progress Notes (Signed)
[facility] - [place] at [facility]   PATIENT NAME: Aaron Rivera    MR#:  [id number]  DATE OF BIRTH:  [birth year]  SUBJECTIVE:   Patient out in the chair.  Feels a lot better.  Denies chest pain.  Shortness of breath improving.  Still has some leg edema. REVIEW OF SYSTEMS:   Review of Systems  Constitutional: Negative for chills, fever and weight loss.  HENT: Negative for ear discharge, ear pain and nosebleeds.   Eyes: Negative for blurred vision, pain and discharge.  Respiratory: Negative for sputum production, shortness of breath, wheezing and stridor.   Cardiovascular: Negative for chest pain, palpitations, orthopnea and PND.  Gastrointestinal: Negative for abdominal pain, diarrhea, nausea and vomiting.  Genitourinary: Negative for frequency and urgency.  Musculoskeletal: Negative for back pain and joint pain.  Neurological: Negative for sensory change, speech change, focal weakness and weakness.  Psychiatric/Behavioral: Negative for depression and hallucinations. The patient is not nervous/anxious.    Tolerating Diet:yes Tolerating PT: Ambulatory  DRUG ALLERGIES:  No Known Allergies  VITALS:  Blood pressure 137/61, pulse 87, temperature 97.6 F (36.4 C), temperature source Oral, resp. rate 18, height 6\' 3"  (1.905 m), weight 96.7 kg (213 lb 1.6 oz), SpO2 98 %.  PHYSICAL EXAMINATION:   Physical Exam  GENERAL:  83 y.o.-year-old patient lying in the bed with no acute distress.  EYES: Pupils equal, round, reactive to light and accommodation. No scleral icterus. Extraocular muscles intact.  HEENT: Head atraumatic, normocephalic. Oropharynx and nasopharynx clear.  NECK:  Supple, no jugular venous distention. No thyroid enlargement, no tenderness.  LUNGS: Normal breath sounds bilaterally, no wheezing, rales, rhonchi. No use of accessory muscles of respiration.  CARDIOVASCULAR: S1, S2 normal. No murmurs, rubs, or gallops.  ABDOMEN: Soft, nontender,  nondistended. Bowel sounds present. No organomegaly or mass. Abdominal obesity EXTREMITIES: No cyanosis, clubbing  ++ edema b/l.    NEUROLOGIC: Cranial nerves II through XII are intact. No focal Motor or sensory deficits b/l.   PSYCHIATRIC:  patient is alert and oriented x 3.  SKIN: No obvious rash, lesion, or ulcer.   LABORATORY PANEL:  CBC Recent Labs  Lab 11/03/17 0449  WBC 8.4  HGB 10.5*  HCT 32.4*  PLT 94*    Chemistries  Recent Labs  Lab 11/03/17 0449  NA 142  K 3.6  CL 111  CO2 20*  GLUCOSE 192*  BUN 23*  CREATININE 1.73*  CALCIUM 8.9  MG 1.8   Cardiac Enzymes Recent Labs  Lab 11/02/17 1915  TROPONINI 0.79*   RADIOLOGY:  Dg Chest 2 View  Result Date: 11/02/2017 CLINICAL DATA:  Shortness of Breath EXAM: CHEST  2 VIEW COMPARISON:  September 05, 2013 FINDINGS: There is fibrotic change throughout the lungs bilaterally, greatest in the right upper lobe and basilar regions. Interstitium is somewhat more prominent than on prior CT suggests that there may be a superimposed degree of interstitial edema. There is a small right pleural effusion. There is cardiomegaly with pulmonary venous hypertension. No adenopathy. There is aortic atherosclerosis. There is degenerative change in each shoulder. IMPRESSION: Suspect a degree of interstitial edema superimposed on fibrosis. There is a small right pleural effusion with cardiomegaly and pulmonary venous hypertension. Suspect a degree of congestive heart failure. There is aortic atherosclerosis. No adenopathy appreciable. Aortic Atherosclerosis (ICD10-I70.0). Electronically Signed   By: Bretta BangWilliam  Woodruff III M.D.   On: 11/02/2017 10:11   ASSESSMENT AND PLAN:    83 y.o. male with known diabetes with complication  chronic kidney disease stage III anemia and coronary artery disease x stent x 2 about 7-8 years ago. patient has had new onset of lower extremity edema and shortness of breath PND and orthopnea over the last 2 days with  significant weight gain has caused him to be severely short of breath and come to the hospital.  chest x-ray consistent with bilateral pulmonary edema and an EKG showing normal sinus rhythm with ventricular bigeminy  1.  Congestive heart failure acute probable systolic. -IV Lasix twice daily -Monitor daily weights, I's and O's, metabolic panel -Seen by Dr. Gwen PoundsKowalski -Echo today -Continue captopril, Cardizem, Crestor -Dietitian consultation -Pharmacy consultation for heart failure  2.  CAD status post stent x2 in the past -Continue other cardiac meds including aspirin  3.  Type 2 diabetes patient not on any diabetes meds at home -We will start patient on glipizide 5 mg twice daily -Sugar checks at home and follow-up with primary care physician  4.  Elevated troponin -Troponins trending down 0.9--0.85--0.79 -Demand ischemia in the setting of CHF -EKG does not show any ST elevation or depression.  Patient in sinus rhythm -DC IV heparin drip  5.  DVT prophylaxis subcu lovenox  Patient is independent and ambulatory at home.  No reports no falls.      Case discussed with Care Management/Social Worker. Management plans discussed with the patient, family and they are in agreement.  CODE STATUS: full  DVT Prophylaxis:lovenox  TOTAL TIME TAKING CARE OF THIS PATIENT: *30* minutes.  >50% time spent on counselling and coordination of care  POSSIBLE D/C IN 1-2 DAYS, DEPENDING ON CLINICAL CONDITION.  Note: This dictation was prepared with Dragon dictation along with smaller phrase technology. Any transcriptional errors that result from this process are unintentional.  Enedina FinnerSona Eleena Grater M.D on 11/03/2017 at 8:08 AM  Between 7am to 6pm - Pager - [phone]  After 6pm go to [email or web address] - Social research officer, governmentpassword [email or web address]  [facility] [place] [facility]  Office  [phone]  CC: Primary care physician; Maple HudsonGilbert, Richard L Jr., MD

## 2017-11-03 NOTE — Care Management Important Message (Signed)
Important Message  Patient Details  Name: Aaron Rivera MRN: [id number] Date of Birth: 1930-01-03   Medicare Important Message Given:  Yes  Signed IM notice given   Eber HongGreene, Rutilio Yellowhair R, RN 11/03/2017, 3:25 PM

## 2017-11-03 NOTE — Progress Notes (Signed)
*  PRELIMINARY RESULTS* Echocardiogram 2D Echocardiogram has been performed.  Aaron Rivera, Aaron Rivera 11/03/2017, 2:58 PM

## 2017-11-03 NOTE — Progress Notes (Signed)
Nutrition Education Note  RD consulted for nutrition education regarding new onset CHF.  RD provided "Low Sodium Nutrition Therapy" handout from the Academy of Nutrition and Dietetics. Reviewed patient's dietary recall. Provided examples on ways to decrease sodium intake in diet. Discouraged intake of processed foods and use of salt shaker. Encouraged fresh fruits and vegetables as well as whole grain sources of carbohydrates to maximize fiber intake.   RD discussed why it is important for patient to adhere to diet recommendations, and emphasized the role of fluids, foods to avoid, and importance of weighing self daily. Teach back method used.  Expect poor compliance.  Body mass index is 26.64 kg/m. Pt meets criteria for normal weight based on current BMI.  Current diet order is heart healthy, patient is consuming approximately 50% of meals at this time. Labs and medications reviewed.   RD will order Ensure Enlive po BID, each supplement provides 350 kcal and 20 grams of protein  No further nutrition interventions warranted at this time. RD contact information provided. If additional nutrition issues arise, please re-consult RD.   Betsey Holidayasey Delaynie Stetzer MS, RD, LDN Pager #- [phone] After Hours Pager: [phone]

## 2017-11-03 NOTE — Discharge Instructions (Signed)
Heart Failure Clinic appointment on November 17 2017 at 11:40am with Clarisa Kindredina Pearlean Sabina, FNP. Please call [phone] to reschedule.

## 2017-11-03 NOTE — Progress Notes (Signed)
Subjective:  83 year old African-American male with a diabetes, chronic kidney disease stage III, anemia, coronary artery disease, admitted With probable acute on chronic systolic CHF, with mildly elevated troponins, likely demand ischemia.  The patient is sitting comfortably at the bedside, and he notes significant improvement in his dyspnea.  He denies any chest pain at this time.  He continues to have some abdominal distention as well as lower extremity edema, but he feels this is improving. Objective:  Vital Signs in the last 24 hours: Temp:  97.6  Pulse Rate:  87 Resp:  18 BP: 137/61 SpO2:  98% Weight:  213 lb 1.6 oz  Intake/Output from previous day:  Intake/Output from this shift:   Physical Exam: General: 83 year old African-American male sitting at the bedside in no acute distress. Eyes: Pupils equal, round, react to light and accommodation.  No xanthoma HEENT: Head atraumatic, normocephalic. Lungs: Normal breath sounds bilaterally, no wheezing, rales, rhonchi. Cardiovascular: Normal S1-S2.  No murmurs, rub, or gallop. Abdomen: Firm, somewhat distended.  Bowel sounds present. Extremities: There is 1-2+ bilateral lower extremity edema.  No cyanosis or clubbing. Neurologic: No focal motor or sensory deficits. Psychiatric: Patient is alert and oriented x3.  Lab Results:  Cardiac Studies:  Assessment/Plan:  1.  Continue with IV Lasix, monitoring kidney function closely. 2.  Echocardiogram completed, results pending. 3.  Continue hypertension control with calcium channel blocker and ACE inhibitor. 4.  Continue daily aspirin for further risk reduction, monitoring closely for bleeding disorder. 5.  Continue high intensity lipid management 6.  Mildly elevated troponins, likely demand ischemia.  Further consideration for stress testing in the future.      Olin PiaBonnie J Keaton Stirewalt 11/03/2017, 3:53 PM

## 2017-11-03 NOTE — Care Management Note (Signed)
Case Management Note  Patient Details  Name: Aaron Rivera MRN: [id number] Date of Birth: November 09, 1930  Subjective/Objective:                  Patient admitted with congestive heart failure exacerbation.  He is not requiring supplemental oxygen.  He is independent in his adls. No issues accessing medical care, paying for meds or with transportation.  Current with his pcp- Dr Sullivan LoneGilbert.  Does not use an assistive device for ambulation. has access to scales   Action/Plan:   Expected Discharge Date:                  Expected Discharge Plan:     In-House Referral:     Discharge planning Services     Post Acute Care Choice:    Choice offered to:     DME Arranged:    DME Agency:     HH Arranged:    HH Agency:     Status of Service:     If discussed at [employer] of [employer] Meetings, dates discussed:    Additional Comments:  Eber HongGreene, Jaskirat Schwieger R, RN 11/03/2017, 2:37 PM

## 2017-11-03 NOTE — Plan of Care (Signed)
  Clinical Measurements: Respiratory complications will improve 11/03/2017 0103 - Not Progressing by Kalman JewelsBallentine, Latasia Silberstein, RN 11/03/2017 0103 - Not Progressing by Kalman JewelsBallentine, Katai Marsico, RN 11/03/2017 0103 - Progressing by Kalman JewelsBallentine, Obe Ahlers, RN   Activity: Risk for activity intolerance will decrease 11/03/2017 0103 - Not Progressing by Kalman JewelsBallentine, Selah Klang, RN 11/03/2017 0103 - Not Progressing by Kalman JewelsBallentine, Novelle Addair, RN 11/03/2017 0103 - Progressing by Kalman JewelsBallentine, Jovaughn Wojtaszek, RN  Patient short of breath with exertion.

## 2017-11-04 DIAGNOSIS — I509 Heart failure, unspecified: Secondary | ICD-10-CM | POA: Diagnosis not present

## 2017-11-04 DIAGNOSIS — I5021 Acute systolic (congestive) heart failure: Secondary | ICD-10-CM | POA: Diagnosis not present

## 2017-11-04 DIAGNOSIS — R748 Abnormal levels of other serum enzymes: Secondary | ICD-10-CM | POA: Diagnosis not present

## 2017-11-04 DIAGNOSIS — E119 Type 2 diabetes mellitus without complications: Secondary | ICD-10-CM | POA: Diagnosis not present

## 2017-11-04 DIAGNOSIS — I10 Essential (primary) hypertension: Secondary | ICD-10-CM | POA: Diagnosis not present

## 2017-11-04 LAB — BASIC METABOLIC PANEL
Anion gap: 10 (ref 5–15)
BUN: 34 mg/dL — AB (ref 6–20)
CHLORIDE: 109 mmol/L (ref 101–111)
CO2: 21 mmol/L — AB (ref 22–32)
CREATININE: 2.01 mg/dL — AB (ref 0.61–1.24)
Calcium: 9.1 mg/dL (ref 8.9–10.3)
GFR calc Af Amer: 33 mL/min — ABNORMAL LOW (ref >60)
GFR calc non Af Amer: 29 mL/min — ABNORMAL LOW (ref >60)
Glucose, Bld: 174 mg/dL — ABNORMAL HIGH (ref 65–99)
Potassium: 3.6 mmol/L (ref 3.5–5.1)
Sodium: 140 mmol/L (ref 135–145)

## 2017-11-04 LAB — ECHOCARDIOGRAM COMPLETE
Height: 75 in
Weight: 3409.6 oz

## 2017-11-04 LAB — GLUCOSE, CAPILLARY
Glucose-Capillary: 151 mg/dL — ABNORMAL HIGH (ref 65–99)
Glucose-Capillary: 188 mg/dL — ABNORMAL HIGH (ref 65–99)

## 2017-11-04 LAB — HEMOGLOBIN A1C
HEMOGLOBIN A1C: 5.6 % (ref 4.8–5.6)
Mean Plasma Glucose: 114 mg/dL

## 2017-11-04 MED ORDER — FUROSEMIDE 40 MG PO TABS: 40.0000 mg | ORAL_TABLET | Freq: Every day | ORAL | 1 refills | Status: AC

## 2017-11-04 MED ORDER — CARVEDILOL 12.5 MG PO TABS: 12.5000 mg | ORAL_TABLET | Freq: Two times a day (BID) | ORAL | Status: DC

## 2017-11-04 MED ORDER — FUROSEMIDE 40 MG PO TABS
40.0000 mg | ORAL_TABLET | Freq: Every day | ORAL | Status: DC
  Administered 2017-11-04: 40 mg via ORAL
  Filled 2017-11-04: qty 1

## 2017-11-04 MED ORDER — CARVEDILOL 3.125 MG PO TABS: 3.1250 mg | ORAL_TABLET | Freq: Two times a day (BID) | ORAL | Status: DC

## 2017-11-04 MED ORDER — CARVEDILOL 12.5 MG PO TABS: 12.5000 mg | ORAL_TABLET | Freq: Two times a day (BID) | ORAL | 1 refills | Status: AC

## 2017-11-04 NOTE — Progress Notes (Signed)
Central telemetry called to notify my that the pt had a 3-beat run of v-tach. Dr. Allena KatzPatel notified via text-page system.

## 2017-11-04 NOTE — Progress Notes (Signed)
SUBJECTIVE: Patient is less short of breath denies any chest pain or shortness of breath at this time.   Vitals:   11/03/17 0806 11/03/17 2053 11/04/17 0351 11/04/17 0903  BP: 137/61 124/71 (!) 128/47 (!) 133/40  Pulse: 87 90 95 98  Resp: 18 18 18 18   Temp: 97.6 F (36.4 C) (!) 97.4 F (36.3 C) 97.6 F (36.4 C) 98 F (36.7 C)  TempSrc: Oral Oral Oral Axillary  SpO2: 98% 98% 94% 97%  Weight:   232 lb 1.6 oz (105.3 kg)   Height:        Intake/Output Summary (Last 24 hours) at 11/04/2017 1125 Last data filed at 11/04/2017 1022 Gross per 24 hour  Intake 240 ml  Output 850 ml  Net -610 ml    LABS: Basic Metabolic Panel: Recent Labs    11/02/17 0944  11/03/17 0449 11/04/17 0612  NA 141  --  142 140  K 3.6  --  3.6 3.6  CL 111  --  111 109  CO2 20*  --  20* 21*  GLUCOSE 178*  --  192* 174*  BUN 20  --  23* 34*  CREATININE 1.86*   < > 1.73* 2.01*  CALCIUM 8.9  --  8.9 9.1  MG 1.4*  --  1.8  --    < > = values in this interval not displayed.   Liver Function Tests: No results for input(s): AST, ALT, ALKPHOS, BILITOT, PROT, ALBUMIN in the last 72 hours. No results for input(s): LIPASE, AMYLASE in the last 72 hours. CBC: Recent Labs    11/02/17 0944 11/03/17 0449  WBC 6.2 8.4  HGB 10.2* 10.5*  HCT 31.6* 32.4*  MCV 93.3 93.8  PLT 87* 94*   Cardiac Enzymes: Recent Labs    11/02/17 0944 11/02/17 1432 11/02/17 1915  TROPONINI 0.94* 0.85* 0.79*   BNP: Invalid input(s): POCBNP D-Dimer: No results for input(s): DDIMER in the last 72 hours. Hemoglobin A1C: Recent Labs    11/02/17 1432  HGBA1C 5.6   Fasting Lipid Panel: No results for input(s): CHOL, HDL, LDLCALC, TRIG, CHOLHDL, LDLDIRECT in the last 72 hours. Thyroid Function Tests: No results for input(s): TSH, T4TOTAL, T3FREE, THYROIDAB in the last 72 hours.  Invalid input(s): FREET3 Anemia Panel: No results for input(s): VITAMINB12, FOLATE, FERRITIN, TIBC, IRON, RETICCTPCT in the last 72  hours.   PHYSICAL EXAM General: Well developed, well nourished, in no acute distress HEENT:  Normocephalic and atramatic Neck:  No JVD.  Lungs: Clear bilaterally to auscultation and percussion. Heart: HRRR . Normal S1 and S2 without gallops or murmurs.  Abdomen: Bowel sounds are positive, abdomen soft and non-tender  Msk:  Back normal, normal gait. Normal strength and tone for age. Extremities: No clubbing, cyanosis or edema.   Neuro: Alert and oriented X 3. Psych:  Good affect, responds appropriately  TELEMETRY: Sinus rhythm with frequent PVCs and nonspecific ST-T changes  ASSESSMENT AND PLAN: Congestive heart failure with left ventricle ejection fraction 30% thus due to systolic dysfunction. Since patient has severe left reticular systolic dysfunction ideally patient should be on entresto, but the creatinine is very high over 2. Advise continuing captopril but discontinue Cardizem as it would cause negative inotropic effect. Advise instead increasing carvedilol dosage. Patient can be discharged with follow-up in the office with Dr. Raymon MuttonKowalsky.  Active Problems:   Acute CHF (congestive heart failure) (HCC)    KHAN,SHAUKAT A, MD, [facility] Center[facility]ACC 11/04/2017 11:25 AM

## 2017-11-04 NOTE — Progress Notes (Addendum)
Educational session with patient.  ? Provided patient with "Living Better with Heart Failure" packet. Briefly reviewed definition of heart failure and signs and symptoms of an exacerbation. Explained to patient HF is a chronic illness which must be self assessed / self-managed along with the help of your Cardiologist, PCP and HF Clinic in order to keep you functioning in your home and to prevent ER visits.  ? ?*Reviewed importance of and reason behind checking weight daily in the AM, after using the bathroom, but before getting dressed.  Patient has means to get scales.   ? Reviewed the following information:   *Discussed when to call the Dr= weight gain of >2-3lb overnight or 5lb in a week,  *Discussed yellow zone= call MD: weight gain of >2-3lb overnight or 5lb in a week, increased swelling, increased SOB when lying down, chest discomfort, dizziness, increased fatigue *Red Zone= call 911: struggle to breath, fainting or near fainting, significant chest pain  ? *Reviewed low sodium diet-provided handout of recommended and not recommended foods. Dietitian has seen patient and educated / reviewed low sodium heart healthy diet with patient.  ? *Instructed patient to take medications as prescribed for heart failure. Explained briefly why pt is on the medications (either make you feel better, live longer or keep you out of the hospital) and discussed monitoring and side effects.  ? *Exercise:  Explained to patient with the dx of CHF and an EF of 30% he  is a candidate to participate in the Cardiac Rehab program.  Overview of Cardiac Rehab provided to patient.   Patient stated at his age of 87 and with his limited income he can not pay all these co-pays.    *Smoking Cessation - patient quit is a former smoker94.   ? Role of [facility] discussed. Heart Failure Clinic Appointment scheduled for November 17, 2017 at 11:40 p.m.  Patient stated, "I am going to take the best possible care of myself with the  means that I have.  I cannot pay $80 co-pays per specialist to see all these specialists.  [facility] and then I [facility] Cardiologist [facility] in addition to my PCP.  I will have to look at all my appointments when I get home to see if I am going to be able to keep this appointment.  Ultimately, I am trusting the good Lord to take care of my needs."    Patient thanked me for coming in and providing this information.  Army Meliaiane Dawnelle Warman, RN, BSN, [facility] Cardiovascular and Pulmonary Nurse [facility]

## 2017-11-04 NOTE — Discharge Summary (Signed)
[facility] - [place] [facility]   PATIENT NAME: Aaron Rivera    MR#:  [id number]  DATE OF BIRTH:  06-22-1930  DATE OF ADMISSION:  11/02/2017 ADMITTING PHYSICIAN: Shaune PollackQing Chen, MD  DATE OF DISCHARGE: 11/04/17  PRIMARY CARE PHYSICIAN: Maple HudsonGilbert, Richard L Jr., MD    ADMISSION DIAGNOSIS:  NSTEMI (non-ST elevated myocardial infarction) (HCC) [I21.4] Acute on chronic congestive heart failure, unspecified heart failure type (HCC) [I50.9]  DISCHARGE DIAGNOSIS:  Acute CHF, systolic Cardiomyopathy with EF 30% CKD-III SECONDARY DIAGNOSIS:   Past Medical History:  Diagnosis Date  . CHF (congestive heart failure) (HCC)   . Diabetes mellitus without complication (HCC)   . Hypertension     HOSPITAL COURSE:  83 y.o.malewith known diabetes with complication chronic kidney disease stage III anemia and coronary artery disease x stent x 2 about 7-8 years ago. patient has had new onset of lower extremity edema and shortness of breath PND and orthopnea over the last 2 days with significant weight gain has caused him to be severely short of breath and come to the hospital. chest x-ray consistent with bilateral pulmonary edema and an EKG showing normal sinus rhythm with ventricular bigeminy  1.  Congestive heart failure acute  systolic. -IV Lasix twice daily---lasix 40 mg daily (creat 2.0) -Monitor daily weights, I's and O's, metabolic panel -Seen by Dr. Gwen PoundsKowalski -Echo reviewed. EF 30% -Continue captopril,coreg, Crestor -d/ced Cardizem -Dietitian consultation -Pharmacy consultation for heart failure  2.  CAD status post stent x2 in the past -Continue other cardiac meds including aspirin  3.  Type 2 diabetes patient not on any diabetes meds at home -cont actos -Sugar checks at home and follow-up with primary care physician  4.  Elevated troponin -Troponins trending down 0.9--0.85--0.79 -Demand ischemia in the setting of CHF -EKG does not show any ST  elevation or depression.  Patient in sinus rhythm -DC IV heparin drip  5.  DVT prophylaxis subcu lovenox  6. CKD-III Monitor creat closely as outpt  Baseline creat 1.5-1.73 -creat today 2.0 (likley due to diuresis)  Pt wants to be discharged to go home. I have asked him to f/u with PCP and cardiology closely as out pt.  Patient is independent and ambulatory at home.  No reports no falls.     D/c home  CONSULTS OBTAINED:  Treatment Team:  Lamar BlinksKowalski, Bruce J, MD Laurier NancyKhan, Shaukat A, MD  DRUG ALLERGIES:  No Known Allergies  DISCHARGE MEDICATIONS:   Allergies as of 11/04/2017   No Known Allergies     Medication List    STOP taking these medications   diltiazem 300 MG 24 hr capsule Commonly known as:  CARDIZEM CD     TAKE these medications   captopril 25 MG tablet Commonly known as:  CAPOTEN TAKE 1/2 TABLET BY MOUTH TWICE A DAY   carvedilol 12.5 MG tablet Commonly known as:  COREG Take 1 tablet (12.5 mg total) by mouth 2 (two) times daily with a meal.   furosemide 40 MG tablet Commonly known as:  LASIX Take 1 tablet (40 mg total) by mouth daily.   KLOR-CON 10 10 MEQ tablet Generic drug:  potassium chloride TAKE 2 TABLETS EVERY DAY   omeprazole 20 MG capsule Commonly known as:  PRILOSEC Take 1 capsule (20 mg total) daily as needed by mouth.   ONETOUCH VERIO test strip Generic drug:  glucose blood USE 2 TIMES A DAY AS INSTRUCTED   pioglitazone 15 MG tablet Commonly known as:  ACTOS  TAKE 1 TABLET (15 MG TOTAL) BY MOUTH EVERY MORNING.   ranitidine 150 MG tablet Commonly known as:  ZANTAC Take 150 mg by mouth at bedtime.   rosuvastatin 20 MG tablet Commonly known as:  CRESTOR Take 1 tablet (20 mg total) by mouth daily.   terazosin 2 MG capsule Commonly known as:  HYTRIN TAKE 1 CAPSULE (2 MG TOTAL) BY MOUTH DAILY.       If you experience worsening of your admission symptoms, develop shortness of breath, life threatening emergency, suicidal or  homicidal thoughts you must seek medical attention immediately by calling 911 or calling your MD immediately  if symptoms less severe.  You Must read complete instructions/literature along with all the possible adverse reactions/side effects for all the Medicines you take and that have been prescribed to you. Take any new Medicines after you have completely understood and accept all the possible adverse reactions/side effects.   Please note  You were cared for by a hospitalist during your hospital stay. If you have any questions about your discharge medications or the care you received while you were in the hospital after you are discharged, you can call the unit and asked to speak with the hospitalist on call if the hospitalist that took care of you is not available. Once you are discharged, your primary care physician will handle any further medical issues. Please note that NO REFILLS for any discharge medications will be authorized once you are discharged, as it is imperative that you return to your primary care physician (or establish a relationship with a primary care physician if you do not have one) for your aftercare needs so that they can reassess your need for medications and monitor your lab values. Today   SUBJECTIVE   Doing well  VITAL SIGNS:  Blood pressure (!) 133/40, pulse 98, temperature 98 F (36.7 C), temperature source Axillary, resp. rate 18, height 6\' 3"  (1.905 m), weight 105.3 kg (232 lb 1.6 oz), SpO2 97 %.  I/O:    Intake/Output Summary (Last 24 hours) at 11/04/2017 1142 Last data filed at 11/04/2017 1022 Gross per 24 hour  Intake 240 ml  Output 850 ml  Net -610 ml    PHYSICAL EXAMINATION:  GENERAL:  83 y.o.-year-old patient lying in the bed with no acute distress.  EYES: Pupils equal, round, reactive to light and accommodation. No scleral icterus. Extraocular muscles intact.  HEENT: Head atraumatic, normocephalic. Oropharynx and nasopharynx clear.  NECK:   Supple, no jugular venous distention. No thyroid enlargement, no tenderness.  LUNGS: Normal breath sounds bilaterally, no wheezing, rales,rhonchi or crepitation. No use of accessory muscles of respiration.  CARDIOVASCULAR: S1, S2 normal. No murmurs, rubs, or gallops.  ABDOMEN: Soft, non-tender, non-distended. Bowel sounds present. No organomegaly or mass.  EXTREMITIES: ++ pedal edema,no cyanosis, or clubbing.  NEUROLOGIC: Cranial nerves II through XII are intact. Muscle strength 5/5 in all extremities. Sensation intact. Gait not checked.  PSYCHIATRIC: The patient is alert and oriented x 3.  SKIN: No obvious rash, lesion, or ulcer.   DATA REVIEW:   CBC  Recent Labs  Lab 11/03/17 0449  WBC 8.4  HGB 10.5*  HCT 32.4*  PLT 94*    Chemistries  Recent Labs  Lab 11/03/17 0449 11/04/17 0612  NA 142 140  K 3.6 3.6  CL 111 109  CO2 20* 21*  GLUCOSE 192* 174*  BUN 23* 34*  CREATININE 1.73* 2.01*  CALCIUM 8.9 9.1  MG 1.8  --  Microbiology Results   No results found for this or any previous visit (from the past 240 hour(s)).  RADIOLOGY:  No results found.   Management plans discussed with the patient, family and they are in agreement.  CODE STATUS:     Code Status Orders  (From admission, onward)        Start     Ordered   11/02/17 1410  Full code  Continuous     11/02/17 1409    Code Status History    Date Active Date Inactive Code Status Order ID Comments User Context   This patient has a current code status but no historical code status.      TOTAL TIME TAKING CARE OF THIS PATIENT: *40* minutes.    Enedina FinnerSona Keyly Baldonado M.D on 11/04/2017 at 11:42 AM  Between 7am to 6pm - Pager - [phone] After 6pm go to [email or web address] - Social research officer, governmentpassword EPAS [facility]  [facility] [place] [facility]  Office  [phone]  CC: Primary care physician; Maple HudsonGilbert, Richard L Jr., MD

## 2017-11-04 NOTE — Progress Notes (Signed)
Patient is to be discharged home today. Patient is in no acute distress at this time, and assessment is unchanged from this morning. Patient's IV is out, discharge paperwork has been discussed with patient/family and there are no questions or concerns at this time. Patient will be accompanied downstairs by staff and family via wheelchair.   

## 2017-11-07 ENCOUNTER — Telehealth: Payer: Self-pay | Admitting: Family Medicine

## 2017-11-07 ENCOUNTER — Encounter: Payer: Self-pay | Admitting: Oncology

## 2017-11-07 ENCOUNTER — Other Ambulatory Visit: Payer: Self-pay

## 2017-11-07 ENCOUNTER — Inpatient Hospital Stay: Payer: Medicare HMO | Attending: Oncology | Admitting: Oncology

## 2017-11-07 ENCOUNTER — Inpatient Hospital Stay: Payer: Medicare HMO

## 2017-11-07 VITALS — BP 111/65 | HR 82 | Temp 97.3°F | Resp 18 | Ht 70.08 in | Wt 217.8 lb

## 2017-11-07 DIAGNOSIS — H409 Unspecified glaucoma: Secondary | ICD-10-CM | POA: Diagnosis not present

## 2017-11-07 DIAGNOSIS — N183 Chronic kidney disease, stage 3 (moderate): Secondary | ICD-10-CM | POA: Diagnosis not present

## 2017-11-07 DIAGNOSIS — E1122 Type 2 diabetes mellitus with diabetic chronic kidney disease: Secondary | ICD-10-CM

## 2017-11-07 DIAGNOSIS — K219 Gastro-esophageal reflux disease without esophagitis: Secondary | ICD-10-CM | POA: Diagnosis not present

## 2017-11-07 DIAGNOSIS — Z79899 Other long term (current) drug therapy: Secondary | ICD-10-CM

## 2017-11-07 DIAGNOSIS — I509 Heart failure, unspecified: Secondary | ICD-10-CM

## 2017-11-07 DIAGNOSIS — R0602 Shortness of breath: Secondary | ICD-10-CM | POA: Diagnosis not present

## 2017-11-07 DIAGNOSIS — R5383 Other fatigue: Secondary | ICD-10-CM

## 2017-11-07 DIAGNOSIS — Z8042 Family history of malignant neoplasm of prostate: Secondary | ICD-10-CM | POA: Diagnosis not present

## 2017-11-07 DIAGNOSIS — I2510 Atherosclerotic heart disease of native coronary artery without angina pectoris: Secondary | ICD-10-CM

## 2017-11-07 DIAGNOSIS — Z955 Presence of coronary angioplasty implant and graft: Secondary | ICD-10-CM | POA: Diagnosis not present

## 2017-11-07 DIAGNOSIS — D696 Thrombocytopenia, unspecified: Secondary | ICD-10-CM

## 2017-11-07 DIAGNOSIS — D649 Anemia, unspecified: Secondary | ICD-10-CM | POA: Diagnosis not present

## 2017-11-07 DIAGNOSIS — Z803 Family history of malignant neoplasm of breast: Secondary | ICD-10-CM

## 2017-11-07 DIAGNOSIS — I130 Hypertensive heart and chronic kidney disease with heart failure and stage 1 through stage 4 chronic kidney disease, or unspecified chronic kidney disease: Secondary | ICD-10-CM

## 2017-11-07 DIAGNOSIS — R531 Weakness: Secondary | ICD-10-CM

## 2017-11-07 DIAGNOSIS — Z87891 Personal history of nicotine dependence: Secondary | ICD-10-CM

## 2017-11-07 DIAGNOSIS — R600 Localized edema: Secondary | ICD-10-CM | POA: Diagnosis not present

## 2017-11-07 LAB — CBC WITH DIFFERENTIAL/PLATELET
BASOS PCT: 1 %
Basophils Absolute: 0.1 10*3/uL (ref 0–0.1)
EOS PCT: 0 %
Eosinophils Absolute: 0 10*3/uL (ref 0–0.7)
HEMATOCRIT: 30.2 % — AB (ref 40.0–52.0)
Hemoglobin: 10 g/dL — ABNORMAL LOW (ref 13.0–18.0)
LYMPHS ABS: 0.8 10*3/uL — AB (ref 1.0–3.6)
Lymphocytes Relative: 8 %
MCH: 31 pg (ref 26.0–34.0)
MCHC: 33.2 g/dL (ref 32.0–36.0)
MCV: 93.4 fL (ref 80.0–100.0)
MONO ABS: 3.1 10*3/uL — AB (ref 0.2–1.0)
Monocytes Relative: 32 %
NEUTROS ABS: 5.7 10*3/uL (ref 1.4–6.5)
Neutrophils Relative %: 59 %
PLATELETS: 104 10*3/uL — AB (ref 150–440)
RBC: 3.23 MIL/uL — ABNORMAL LOW (ref 4.40–5.90)
RDW: 13.7 % (ref 11.5–14.5)
WBC: 9.7 10*3/uL (ref 3.8–10.6)

## 2017-11-07 LAB — COMPREHENSIVE METABOLIC PANEL
ALT: 62 U/L (ref 17–63)
AST: 72 U/L — ABNORMAL HIGH (ref 15–41)
Albumin: 3.5 g/dL (ref 3.5–5.0)
Alkaline Phosphatase: 73 U/L (ref 38–126)
Anion gap: 11 (ref 5–15)
BUN: 50 mg/dL — AB (ref 6–20)
CALCIUM: 9 mg/dL (ref 8.9–10.3)
CHLORIDE: 107 mmol/L (ref 101–111)
CO2: 22 mmol/L (ref 22–32)
Creatinine, Ser: 2.55 mg/dL — ABNORMAL HIGH (ref 0.61–1.24)
GFR, EST AFRICAN AMERICAN: 25 mL/min — AB (ref >60)
GFR, EST NON AFRICAN AMERICAN: 21 mL/min — AB (ref >60)
GLUCOSE: 249 mg/dL — AB (ref 65–99)
POTASSIUM: 3.5 mmol/L (ref 3.5–5.1)
Sodium: 140 mmol/L (ref 135–145)
Total Bilirubin: 0.9 mg/dL (ref 0.3–1.2)
Total Protein: 7.8 g/dL (ref 6.5–8.1)

## 2017-11-07 LAB — LACTATE DEHYDROGENASE: LDH: 260 U/L — ABNORMAL HIGH (ref 98–192)

## 2017-11-07 LAB — RETICULOCYTES
RBC.: 3.34 MIL/uL — ABNORMAL LOW (ref 4.40–5.90)
RETIC COUNT ABSOLUTE: 93.5 10*3/uL (ref 19.0–183.0)
Retic Ct Pct: 2.8 % (ref 0.4–3.1)

## 2017-11-07 LAB — TECHNOLOGIST SMEAR REVIEW

## 2017-11-07 NOTE — Telephone Encounter (Signed)
Marcelino DusterMichelle,  I have tried to schedule this but have been unable.  Can you call the patient and schedule him for the 26 or 27.  Thanks Northwest AirlinesElena

## 2017-11-07 NOTE — Progress Notes (Signed)
Hematology/Oncology Consult note [facility] [phone] [phone]   Patient Care Team: Maple Hudson., MD as PCP - General (Family Medicine)  REFERRING PROVIDER: Maple Hudson., MD CHIEF COMPLAINTS/PURPOSE OF CONSULTATION:  Evaluation of anemia.  HISTORY OF PRESENTING ILLNESS:  Aaron Rivera is a  83 y.o.  male with PMH listed below who was referred to me for evaluation of anemia. Patient has a history of chronic kidney disease stage III, long-standing anemia, coronary artery disease status post stent 2, recent admission to hospital due to acute on chronic congestive heart failure. She was admitted due to new onset of lower extremity edema and shortness of breath, PND. His medication was adjusted and optimized.  Patient is referred to me for evaluation of anemia. Patient reports feeling fatigued, no energy, and feels like his lower extremities are weak bilaterally. Feels like "my legs are giving up" Today he denies any chest pain, shortness of breath, abdominal pain. Last colonoscopy was within 10 years ago. Denies seeing any blood in the stool. Patient sounded comment patient to today's clinic., Patient lives by himself and is able to walk around his house. He is able to do some basics.   MEDICAL HISTORY:  Past Medical History:  Diagnosis Date  . CHF (congestive heart failure) (HCC)   . Diabetes mellitus without complication (HCC)   . GERD (gastroesophageal reflux disease)   . Glaucoma    left eye   . Hypertension     SURGICAL HISTORY: Past Surgical History:  Procedure Laterality Date  . ABDOMINAL AORTIC ANEURYSM REPAIR    . cataract surgert Bilateral 2018    SOCIAL HISTORY: Social History   Socioeconomic History  . Marital status: Widowed    Spouse name: Not on file  . Number of children: Not on file  . Years of education: Not on file  . Highest education level: Not on file  Social Needs  . Financial  resource strain: Not on file  . Food insecurity - worry: Not on file  . Food insecurity - inability: Not on file  . Transportation needs - medical: Not on file  . Transportation needs - non-medical: Not on file  Occupational History  . Not on file  Tobacco Use  . Smoking status: Former Smoker    Packs/day: 1.00    Years: 40.00    Pack years: 40.00    Types: Cigarettes    Last attempt to quit: 11/07/1977    Years since quitting: 40.0  . Smokeless tobacco: Former Neurosurgeon    Quit date: 11/20/1969  Substance and Sexual Activity  . Alcohol use: No    Alcohol/week: 0.0 oz  . Drug use: No  . Sexual activity: No  Other Topics Concern  . Not on file  Social History Narrative  . Not on file    FAMILY HISTORY: Family History  Problem Relation Age of Onset  . Heart disease Mother   . Breast cancer Sister   . Prostate cancer Brother   . Hypertension Brother   . Hypertension Brother     ALLERGIES:  has No Known Allergies.  MEDICATIONS:  Current Outpatient Medications  Medication Sig Dispense Refill  . captopril (CAPOTEN) 25 MG tablet TAKE 1/2 TABLET BY MOUTH TWICE A DAY 90 tablet 3  . carvedilol (COREG) 12.5 MG tablet Take 1 tablet (12.5 mg total) by mouth 2 (two) times daily with a meal. 60 tablet 1  . latanoprost (XALATAN) 0.005 % ophthalmic solution Place 1  drop into both eyes.     Marland Kitchen. omeprazole (PRILOSEC) 20 MG capsule Take 1 capsule (20 mg total) daily as needed by mouth. 90 capsule 0  . ONETOUCH VERIO test strip USE 2 TIMES A DAY AS INSTRUCTED 100 each 12  . rosuvastatin (CRESTOR) 20 MG tablet Take 1 tablet (20 mg total) by mouth daily. 90 tablet 3  . diltiazem (CARDIZEM CD) 300 MG 24 hr capsule Take 300 mg by mouth daily.  3  . furosemide (LASIX) 40 MG tablet Take 1 tablet (40 mg total) by mouth daily. (Patient not taking: Reported on 11/07/2017) 30 tablet 1  . KLOR-CON 10 10 MEQ tablet TAKE 2 TABLETS EVERY DAY (Patient not taking: Reported on 11/07/2017) 90 tablet 3  .  Lifitegrast (XIIDRA) 5 % SOLN     . pioglitazone (ACTOS) 15 MG tablet TAKE 1 TABLET (15 MG TOTAL) BY MOUTH EVERY MORNING. (Patient not taking: Reported on 11/02/2017) 90 tablet 3  . ranitidine (ZANTAC) 150 MG tablet Take 150 mg by mouth at bedtime.  12  . terazosin (HYTRIN) 2 MG capsule TAKE 1 CAPSULE (2 MG TOTAL) BY MOUTH DAILY.  3   No current facility-administered medications for this visit.      PHYSICAL EXAMINATION: ECOG PERFORMANCE STATUS: 2 - Symptomatic, <50% confined to bed Vitals:   11/07/17 1216  BP: 111/65  Pulse: 82  Resp: 18  Temp: (!) 97.3 F (36.3 C)   Filed Weights   11/07/17 1216  Weight: 217 lb 12.8 oz (98.8 kg)    Physical Exam   LABORATORY DATA:  I have reviewed the data as listed Lab Results  Component Value Date   WBC 9.7 11/07/2017   HGB 10.0 (L) 11/07/2017   HCT 30.2 (L) 11/07/2017   MCV 93.4 11/07/2017   PLT 104 (L) 11/07/2017   Recent Labs    03/27/17 0946 09/27/17 0915  11/03/17 0449 11/04/17 0612 11/07/17 1322  NA  --  139   < > 142 140 140  K  --  3.4*   < > 3.6 3.6 3.5  CL  --  103   < > 111 109 107  CO2  --  28   < > 20* 21* 22  GLUCOSE  --  135*   < > 192* 174* 249*  BUN  --  24   < > 23* 34* 50*  CREATININE  --  1.87*   < > 1.73* 2.01* 2.55*  CALCIUM  --  9.2   < > 8.9 9.1 9.0  GFRNONAA  --  32*   < > 34* 29* 21*  GFRAA  --  37*   < > 40* 33* 25*  PROT 7.5 7.3  --   --   --  7.8  ALBUMIN 4.1  --   --   --   --  3.5  AST 19 16  --   --   --  72*  ALT 10 10  --   --   --  62  ALKPHOS 90  --   --   --   --  73  BILITOT 0.5 0.6  --   --   --  0.9  BILIDIR 0.16  --   --   --   --   --    < > = values in this interval not displayed.     ASSESSMENT & PLAN:  1. Normocytic anemia   2. Thrombocytopenia (HCC)    Anemia: multifactorial with possible  causes including most likely chronic kidney disease, or other etiologies including chronic blood loss, hyper/hypothyroidism, nutritional deficiency, infection/chronic inflammation,  hemolysis, underlying bone marrow disorders. Will check CBC w differential, CMP, vitamin B12, Folate, iron/TIBC, ferritin, reticulocytes, fecal occult, blood smear, TSH,  LDH, haptoglobin, monoclonal gammopathy evaluation.    Thrombocytopenia: Workup as above.  All questions were answered. The patient knows to call the clinic with any problems questions or concerns.  Return of visit: 10 days.  Thank you for this kind referral and the opportunity to participate in the care of this patient. A copy of today's note is routed to referring provider    Rickard PatienceZhou Gwenda Heiner, MD, PhD Hematology Oncology [facility] Pager- [phone] 11/07/2017

## 2017-11-07 NOTE — Progress Notes (Signed)
Here for new pt evaluation.  

## 2017-11-07 NOTE — Telephone Encounter (Signed)
Pt's son Iantha FallenKenneth stated pt was discharged from hospital on 11/04/17 and needs a f/u within 1 week of discharge. When can pt be worked in? Please advise. Thanks TNP

## 2017-11-07 NOTE — Telephone Encounter (Signed)
I can see him the [phone] or 27th. If it needs to be earlier maybe Dr Jerral RalphBcan see him for me? thanks

## 2017-11-07 NOTE — Telephone Encounter (Signed)
Please review

## 2017-11-08 LAB — HAPTOGLOBIN: Haptoglobin: 357 mg/dL — ABNORMAL HIGH (ref 34–200)

## 2017-11-08 LAB — HEPATITIS PANEL, ACUTE
Hep A IgM: NEGATIVE
Hep B C IgM: NEGATIVE
Hepatitis B Surface Ag: NEGATIVE

## 2017-11-08 LAB — KAPPA/LAMBDA LIGHT CHAINS
KAPPA FREE LGHT CHN: 69.7 mg/L — AB (ref 3.3–19.4)
KAPPA, LAMDA LIGHT CHAIN RATIO: 2.44 — AB (ref 0.26–1.65)
LAMDA FREE LIGHT CHAINS: 28.6 mg/L — AB (ref 5.7–26.3)

## 2017-11-09 LAB — COMP PANEL: LEUKEMIA/LYMPHOMA

## 2017-11-09 LAB — PROTEIN ELECTROPHORESIS, SERUM
A/G RATIO SPE: 0.8 (ref 0.7–1.7)
ALBUMIN ELP: 3.3 g/dL (ref 2.9–4.4)
ALPHA-1-GLOBULIN: 0.4 g/dL (ref 0.0–0.4)
ALPHA-2-GLOBULIN: 1.1 g/dL — AB (ref 0.4–1.0)
Beta Globulin: 1.1 g/dL (ref 0.7–1.3)
GLOBULIN, TOTAL: 3.9 g/dL (ref 2.2–3.9)
Gamma Globulin: 1.4 g/dL (ref 0.4–1.8)
Total Protein ELP: 7.2 g/dL (ref 6.0–8.5)

## 2017-11-10 ENCOUNTER — Other Ambulatory Visit: Payer: Self-pay | Admitting: Oncology

## 2017-11-10 DIAGNOSIS — D72821 Monocytosis (symptomatic): Secondary | ICD-10-CM

## 2017-11-14 ENCOUNTER — Encounter (HOSPITAL_COMMUNITY): Payer: Self-pay

## 2017-11-14 ENCOUNTER — Inpatient Hospital Stay (HOSPITAL_COMMUNITY): Payer: Medicare HMO

## 2017-11-14 ENCOUNTER — Emergency Department (HOSPITAL_COMMUNITY): Payer: Medicare HMO

## 2017-11-14 ENCOUNTER — Other Ambulatory Visit: Payer: Self-pay

## 2017-11-14 ENCOUNTER — Inpatient Hospital Stay (HOSPITAL_COMMUNITY)
Admission: EM | Admit: 2017-11-14 | Discharge: 2017-12-22 | DRG: 064 | Disposition: E | Payer: Medicare HMO | Attending: Family Medicine | Admitting: Family Medicine

## 2017-11-14 DIAGNOSIS — R1319 Other dysphagia: Secondary | ICD-10-CM | POA: Diagnosis not present

## 2017-11-14 DIAGNOSIS — R4701 Aphasia: Secondary | ICD-10-CM | POA: Diagnosis present

## 2017-11-14 DIAGNOSIS — N184 Chronic kidney disease, stage 4 (severe): Secondary | ICD-10-CM | POA: Diagnosis present

## 2017-11-14 DIAGNOSIS — I2510 Atherosclerotic heart disease of native coronary artery without angina pectoris: Secondary | ICD-10-CM | POA: Diagnosis present

## 2017-11-14 DIAGNOSIS — K5730 Diverticulosis of large intestine without perforation or abscess without bleeding: Secondary | ICD-10-CM | POA: Diagnosis not present

## 2017-11-14 DIAGNOSIS — Z66 Do not resuscitate: Secondary | ICD-10-CM | POA: Diagnosis not present

## 2017-11-14 DIAGNOSIS — K219 Gastro-esophageal reflux disease without esophagitis: Secondary | ICD-10-CM | POA: Diagnosis present

## 2017-11-14 DIAGNOSIS — E119 Type 2 diabetes mellitus without complications: Secondary | ICD-10-CM | POA: Diagnosis not present

## 2017-11-14 DIAGNOSIS — I10 Essential (primary) hypertension: Secondary | ICD-10-CM | POA: Diagnosis not present

## 2017-11-14 DIAGNOSIS — E782 Mixed hyperlipidemia: Secondary | ICD-10-CM

## 2017-11-14 DIAGNOSIS — R1310 Dysphagia, unspecified: Secondary | ICD-10-CM | POA: Diagnosis not present

## 2017-11-14 DIAGNOSIS — Z4659 Encounter for fitting and adjustment of other gastrointestinal appliance and device: Secondary | ICD-10-CM

## 2017-11-14 DIAGNOSIS — R29726 NIHSS score 26: Secondary | ICD-10-CM | POA: Diagnosis present

## 2017-11-14 DIAGNOSIS — I714 Abdominal aortic aneurysm, without rupture, unspecified: Secondary | ICD-10-CM | POA: Diagnosis present

## 2017-11-14 DIAGNOSIS — G936 Cerebral edema: Secondary | ICD-10-CM | POA: Diagnosis not present

## 2017-11-14 DIAGNOSIS — Z79899 Other long term (current) drug therapy: Secondary | ICD-10-CM

## 2017-11-14 DIAGNOSIS — I6523 Occlusion and stenosis of bilateral carotid arteries: Secondary | ICD-10-CM | POA: Diagnosis present

## 2017-11-14 DIAGNOSIS — R531 Weakness: Secondary | ICD-10-CM | POA: Diagnosis not present

## 2017-11-14 DIAGNOSIS — Z8673 Personal history of transient ischemic attack (TIA), and cerebral infarction without residual deficits: Secondary | ICD-10-CM

## 2017-11-14 DIAGNOSIS — I5022 Chronic systolic (congestive) heart failure: Secondary | ICD-10-CM | POA: Diagnosis not present

## 2017-11-14 DIAGNOSIS — E1165 Type 2 diabetes mellitus with hyperglycemia: Secondary | ICD-10-CM | POA: Diagnosis present

## 2017-11-14 DIAGNOSIS — I672 Cerebral atherosclerosis: Secondary | ICD-10-CM | POA: Diagnosis present

## 2017-11-14 DIAGNOSIS — I63132 Cerebral infarction due to embolism of left carotid artery: Secondary | ICD-10-CM

## 2017-11-14 DIAGNOSIS — J189 Pneumonia, unspecified organism: Secondary | ICD-10-CM | POA: Diagnosis not present

## 2017-11-14 DIAGNOSIS — Z4682 Encounter for fitting and adjustment of non-vascular catheter: Secondary | ICD-10-CM | POA: Diagnosis not present

## 2017-11-14 DIAGNOSIS — R402312 Coma scale, best motor response, none, at arrival to emergency department: Secondary | ICD-10-CM | POA: Diagnosis not present

## 2017-11-14 DIAGNOSIS — E785 Hyperlipidemia, unspecified: Secondary | ICD-10-CM | POA: Diagnosis present

## 2017-11-14 DIAGNOSIS — I130 Hypertensive heart and chronic kidney disease with heart failure and stage 1 through stage 4 chronic kidney disease, or unspecified chronic kidney disease: Secondary | ICD-10-CM | POA: Diagnosis not present

## 2017-11-14 DIAGNOSIS — I351 Nonrheumatic aortic (valve) insufficiency: Secondary | ICD-10-CM | POA: Diagnosis present

## 2017-11-14 DIAGNOSIS — I63412 Cerebral infarction due to embolism of left middle cerebral artery: Secondary | ICD-10-CM | POA: Diagnosis not present

## 2017-11-14 DIAGNOSIS — H409 Unspecified glaucoma: Secondary | ICD-10-CM | POA: Diagnosis present

## 2017-11-14 DIAGNOSIS — I69992 Facial weakness following unspecified cerebrovascular disease: Secondary | ICD-10-CM | POA: Diagnosis not present

## 2017-11-14 DIAGNOSIS — G8191 Hemiplegia, unspecified affecting right dominant side: Secondary | ICD-10-CM | POA: Diagnosis not present

## 2017-11-14 DIAGNOSIS — I639 Cerebral infarction, unspecified: Secondary | ICD-10-CM | POA: Diagnosis not present

## 2017-11-14 DIAGNOSIS — Z87891 Personal history of nicotine dependence: Secondary | ICD-10-CM | POA: Diagnosis not present

## 2017-11-14 DIAGNOSIS — N401 Enlarged prostate with lower urinary tract symptoms: Secondary | ICD-10-CM | POA: Diagnosis present

## 2017-11-14 DIAGNOSIS — J690 Pneumonitis due to inhalation of food and vomit: Secondary | ICD-10-CM | POA: Diagnosis present

## 2017-11-14 DIAGNOSIS — E1122 Type 2 diabetes mellitus with diabetic chronic kidney disease: Secondary | ICD-10-CM | POA: Diagnosis present

## 2017-11-14 DIAGNOSIS — E876 Hypokalemia: Secondary | ICD-10-CM | POA: Diagnosis not present

## 2017-11-14 DIAGNOSIS — I5042 Chronic combined systolic (congestive) and diastolic (congestive) heart failure: Secondary | ICD-10-CM | POA: Diagnosis not present

## 2017-11-14 DIAGNOSIS — J90 Pleural effusion, not elsewhere classified: Secondary | ICD-10-CM | POA: Diagnosis not present

## 2017-11-14 DIAGNOSIS — F329 Major depressive disorder, single episode, unspecified: Secondary | ICD-10-CM | POA: Diagnosis present

## 2017-11-14 DIAGNOSIS — Z515 Encounter for palliative care: Secondary | ICD-10-CM | POA: Diagnosis not present

## 2017-11-14 DIAGNOSIS — I63512 Cerebral infarction due to unspecified occlusion or stenosis of left middle cerebral artery: Secondary | ICD-10-CM | POA: Diagnosis not present

## 2017-11-14 DIAGNOSIS — I509 Heart failure, unspecified: Secondary | ICD-10-CM | POA: Diagnosis not present

## 2017-11-14 DIAGNOSIS — R402214 Coma scale, best verbal response, none, 24 hours or more after hospital admission: Secondary | ICD-10-CM | POA: Diagnosis present

## 2017-11-14 DIAGNOSIS — R402212 Coma scale, best verbal response, none, at arrival to emergency department: Secondary | ICD-10-CM | POA: Diagnosis not present

## 2017-11-14 DIAGNOSIS — I6789 Other cerebrovascular disease: Secondary | ICD-10-CM | POA: Diagnosis not present

## 2017-11-14 DIAGNOSIS — R402114 Coma scale, eyes open, never, 24 hours or more after hospital admission: Secondary | ICD-10-CM | POA: Diagnosis present

## 2017-11-14 DIAGNOSIS — R402434 Glasgow coma scale score 3-8, 24 hours or more after hospital admission: Secondary | ICD-10-CM | POA: Diagnosis not present

## 2017-11-14 DIAGNOSIS — R402314 Coma scale, best motor response, none, 24 hours or more after hospital admission: Secondary | ICD-10-CM | POA: Diagnosis present

## 2017-11-14 DIAGNOSIS — I6529 Occlusion and stenosis of unspecified carotid artery: Secondary | ICD-10-CM | POA: Diagnosis present

## 2017-11-14 DIAGNOSIS — R402112 Coma scale, eyes open, never, at arrival to emergency department: Secondary | ICD-10-CM | POA: Diagnosis not present

## 2017-11-14 DIAGNOSIS — R4702 Dysphasia: Secondary | ICD-10-CM | POA: Diagnosis present

## 2017-11-14 LAB — PROTIME-INR
INR: 1.49
Prothrombin Time: 17.9 seconds — ABNORMAL HIGH (ref 11.4–15.2)

## 2017-11-14 LAB — DIFFERENTIAL
BASOS ABS: 0 10*3/uL (ref 0.0–0.1)
BASOS PCT: 0 %
Eosinophils Absolute: 0 10*3/uL (ref 0.0–0.7)
Eosinophils Relative: 0 %
LYMPHS PCT: 16 %
Lymphs Abs: 1 10*3/uL (ref 0.7–4.0)
MONOS PCT: 22 %
Monocytes Absolute: 1.4 10*3/uL — ABNORMAL HIGH (ref 0.1–1.0)
NEUTROS ABS: 4 10*3/uL (ref 1.7–7.7)
Neutrophils Relative %: 62 %

## 2017-11-14 LAB — COMPREHENSIVE METABOLIC PANEL
ALK PHOS: 112 U/L (ref 38–126)
ALT: 152 U/L — AB (ref 17–63)
AST: 108 U/L — AB (ref 15–41)
Albumin: 3 g/dL — ABNORMAL LOW (ref 3.5–5.0)
Anion gap: 12 (ref 5–15)
BUN: 32 mg/dL — AB (ref 6–20)
CHLORIDE: 108 mmol/L (ref 101–111)
CO2: 23 mmol/L (ref 22–32)
CREATININE: 2.66 mg/dL — AB (ref 0.61–1.24)
Calcium: 8.7 mg/dL — ABNORMAL LOW (ref 8.9–10.3)
GFR calc Af Amer: 24 mL/min — ABNORMAL LOW (ref >60)
GFR, EST NON AFRICAN AMERICAN: 20 mL/min — AB (ref >60)
Glucose, Bld: 188 mg/dL — ABNORMAL HIGH (ref 65–99)
Potassium: 3.5 mmol/L (ref 3.5–5.1)
Sodium: 143 mmol/L (ref 135–145)
Total Bilirubin: 1.3 mg/dL — ABNORMAL HIGH (ref 0.3–1.2)
Total Protein: 7.4 g/dL (ref 6.5–8.1)

## 2017-11-14 LAB — I-STAT CHEM 8, ED
BUN: 31 mg/dL — AB (ref 6–20)
CREATININE: 2.6 mg/dL — AB (ref 0.61–1.24)
Calcium, Ion: 1.12 mmol/L — ABNORMAL LOW (ref 1.15–1.40)
Chloride: 110 mmol/L (ref 101–111)
Glucose, Bld: 188 mg/dL — ABNORMAL HIGH (ref 65–99)
HEMATOCRIT: 34 % — AB (ref 39.0–52.0)
Hemoglobin: 11.6 g/dL — ABNORMAL LOW (ref 13.0–17.0)
POTASSIUM: 3.5 mmol/L (ref 3.5–5.1)
Sodium: 147 mmol/L — ABNORMAL HIGH (ref 135–145)
TCO2: 23 mmol/L (ref 22–32)

## 2017-11-14 LAB — I-STAT TROPONIN, ED: TROPONIN I, POC: 0.1 ng/mL — AB (ref 0.00–0.08)

## 2017-11-14 LAB — CBG MONITORING, ED: Glucose-Capillary: 173 mg/dL — ABNORMAL HIGH (ref 65–99)

## 2017-11-14 LAB — CBC
HEMATOCRIT: 33.8 % — AB (ref 39.0–52.0)
HEMOGLOBIN: 10.8 g/dL — AB (ref 13.0–17.0)
MCH: 29.8 pg (ref 26.0–34.0)
MCHC: 32 g/dL (ref 30.0–36.0)
MCV: 93.1 fL (ref 78.0–100.0)
Platelets: 101 10*3/uL — ABNORMAL LOW (ref 150–400)
RBC: 3.63 MIL/uL — AB (ref 4.22–5.81)
RDW: 14.2 % (ref 11.5–15.5)
WBC: 6.4 10*3/uL (ref 4.0–10.5)

## 2017-11-14 LAB — APTT: APTT: 32 s (ref 24–36)

## 2017-11-14 LAB — MRSA PCR SCREENING: MRSA BY PCR: POSITIVE — AB

## 2017-11-14 MED ORDER — SODIUM CHLORIDE 0.9 % IV SOLN
INTRAVENOUS | Status: AC
  Administered 2017-11-14: 22:00:00 via INTRAVENOUS

## 2017-11-14 MED ORDER — ASPIRIN 300 MG RE SUPP
300.0000 mg | Freq: Once | RECTAL | Status: AC
  Administered 2017-11-14: 300 mg via RECTAL
  Filled 2017-11-14: qty 1

## 2017-11-14 MED ORDER — IOPAMIDOL (ISOVUE-370) INJECTION 76%
INTRAVENOUS | Status: AC
  Administered 2017-11-14: 90 mL
  Filled 2017-11-14: qty 100

## 2017-11-14 MED ORDER — MUPIROCIN 2 % EX OINT
1.0000 "application " | TOPICAL_OINTMENT | Freq: Two times a day (BID) | CUTANEOUS | Status: AC
  Administered 2017-11-14 – 2017-11-19 (×10): 1 via NASAL
  Filled 2017-11-14 (×5): qty 22

## 2017-11-14 MED ORDER — ACETAMINOPHEN 160 MG/5ML PO SOLN: 650.0000 mg | ORAL | Status: DC | PRN

## 2017-11-14 MED ORDER — SODIUM CHLORIDE 0.9 % IV SOLN
INTRAVENOUS | Status: DC
  Administered 2017-11-14: 18:00:00 via INTRAVENOUS

## 2017-11-14 MED ORDER — PIPERACILLIN-TAZOBACTAM 3.375 G IVPB 30 MIN
3.3750 g | Freq: Once | INTRAVENOUS | Status: AC
  Administered 2017-11-14: 3.375 g via INTRAVENOUS
  Filled 2017-11-14: qty 50

## 2017-11-14 MED ORDER — ACETAMINOPHEN 325 MG PO TABS: 650.0000 mg | ORAL_TABLET | ORAL | Status: DC | PRN

## 2017-11-14 MED ORDER — VANCOMYCIN HCL IN DEXTROSE 1-5 GM/200ML-% IV SOLN
1000.0000 mg | Freq: Once | INTRAVENOUS | Status: AC
  Administered 2017-11-14: 1000 mg via INTRAVENOUS
  Filled 2017-11-14: qty 200

## 2017-11-14 MED ORDER — STROKE: EARLY STAGES OF RECOVERY BOOK
Freq: Once | Status: AC
  Administered 2017-11-14: 15:00:00
  Filled 2017-11-14: qty 1

## 2017-11-14 MED ORDER — ACETAMINOPHEN 650 MG RE SUPP: 650.0000 mg | RECTAL | Status: DC | PRN

## 2017-11-14 MED ORDER — CHLORHEXIDINE GLUCONATE CLOTH 2 % EX PADS
6.0000 | MEDICATED_PAD | Freq: Every day | CUTANEOUS | Status: AC
  Administered 2017-11-15 – 2017-11-19 (×5): 6 via TOPICAL

## 2017-11-14 MED ORDER — ASPIRIN 300 MG RE SUPP
300.0000 mg | Freq: Every day | RECTAL | Status: DC
  Administered 2017-11-15 – 2017-11-18 (×4): 300 mg via RECTAL
  Filled 2017-11-14 (×5): qty 1

## 2017-11-14 MED ORDER — ASPIRIN 325 MG PO TABS: 325.0000 mg | ORAL_TABLET | Freq: Every day | ORAL | Status: DC

## 2017-11-14 MED ORDER — HEPARIN SODIUM (PORCINE) 5000 UNIT/ML IJ SOLN
5000.0000 [IU] | Freq: Three times a day (TID) | INTRAMUSCULAR | Status: DC
  Administered 2017-11-14 – 2017-11-19 (×14): 5000 [IU] via SUBCUTANEOUS
  Filled 2017-11-14 (×14): qty 1

## 2017-11-14 MED ORDER — WHITE PETROLATUM EX OINT
TOPICAL_OINTMENT | CUTANEOUS | Status: DC | PRN
  Administered 2017-11-14 – 2017-11-21 (×4): via TOPICAL
  Filled 2017-11-14 (×3): qty 28.35

## 2017-11-14 NOTE — ED Notes (Signed)
Admitting at bedside 

## 2017-11-14 NOTE — ED Notes (Signed)
Pt transported to MRI 

## 2017-11-14 NOTE — Consult Note (Addendum)
Neurology Consultation  Reason for Consult: Acute code stroke Referring Physician: Dr. Ethelda ChickJacubowitz  CC: Right-sided weakness, left gaze  History is obtained from: Patient's family, chart, EMS  HPI: Aaron Rivera is a 83 y.o. male past medical history of CHF, recent discharge for an STEMI and acute on chronic congestive heart failure, chronic kidney disease 3, diabetes, hypertension, presents to the emergency room via EMS as an acute code stroke.  Last seen normal last night before he went to bed witnessed by family, did not wake up this morning and when family went to check they found him on the floor unresponsive.  He was not able to talk or move the right side of his body. 911 was called and patient was brought into the emergency room at [facility] for further evaluation. Patient CBG on site was greater than 200.  His systolic blood pressure was in the range of 130-160.  His heart rate was 65. He had leftward gaze preference on initial EMS examination along right-sided hemiplegia. At baseline he lives independently, does not cook but eats outside where he goes by himself, still driving.  LKW: 11:30 PM on 11/13/2017 tpa given?: no, outside the window Premorbid modified Rankin scale (mRS): 0  ROS:  Unable to obtain due to altered mental status.   Past Medical History:  Diagnosis Date  . CHF (congestive heart failure) (HCC)   . Diabetes mellitus without complication (HCC)   . GERD (gastroesophageal reflux disease)   . Glaucoma    left eye   . Hypertension     Family History  Problem Relation Age of Onset  . Heart disease Mother   . Breast cancer Sister   . Prostate cancer Brother   . Hypertension Brother   . Hypertension Brother    Social History:   reports that he quit smoking about 40 years ago. His smoking use included cigarettes. He has a 40.00 pack-year smoking history. He quit smokeless tobacco use about 48 years ago. He reports that he does not drink alcohol or use  drugs.  Medications No current facility-administered medications for this encounter.   Current Outpatient Medications:  .  captopril (CAPOTEN) 25 MG tablet, TAKE 1/2 TABLET BY MOUTH TWICE A DAY, Disp: 90 tablet, Rfl: 3 .  carvedilol (COREG) 12.5 MG tablet, Take 1 tablet (12.5 mg total) by mouth 2 (two) times daily with a meal., Disp: 60 tablet, Rfl: 1 .  diltiazem (CARDIZEM CD) 300 MG 24 hr capsule, Take 300 mg by mouth daily., Disp: , Rfl: 3 .  furosemide (LASIX) 40 MG tablet, Take 1 tablet (40 mg total) by mouth daily. (Patient not taking: Reported on 11/07/2017), Disp: 30 tablet, Rfl: 1 .  KLOR-CON 10 10 MEQ tablet, TAKE 2 TABLETS EVERY DAY (Patient not taking: Reported on 11/07/2017), Disp: 90 tablet, Rfl: 3 .  latanoprost (XALATAN) 0.005 % ophthalmic solution, Place 1 drop into both eyes. , Disp: , Rfl:  .  Lifitegrast (XIIDRA) 5 % SOLN, , Disp: , Rfl:  .  omeprazole (PRILOSEC) 20 MG capsule, Take 1 capsule (20 mg total) daily as needed by mouth., Disp: 90 capsule, Rfl: 0 .  ONETOUCH VERIO test strip, USE 2 TIMES A DAY AS INSTRUCTED, Disp: 100 each, Rfl: 12 .  pioglitazone (ACTOS) 15 MG tablet, TAKE 1 TABLET (15 MG TOTAL) BY MOUTH EVERY MORNING. (Patient not taking: Reported on 11/02/2017), Disp: 90 tablet, Rfl: 3 .  ranitidine (ZANTAC) 150 MG tablet, Take 150 mg by mouth at bedtime., Disp: ,  Rfl: 12 .  rosuvastatin (CRESTOR) 20 MG tablet, Take 1 tablet (20 mg total) by mouth daily., Disp: 90 tablet, Rfl: 3 .  terazosin (HYTRIN) 2 MG capsule, TAKE 1 CAPSULE (2 MG TOTAL) BY MOUTH DAILY., Disp: , Rfl: 3  Exam: Current vital signs: BP (!) 146/57 (BP Location: Right Arm)   Pulse 65   Temp 97.8 F (36.6 C) (Temporal)   Resp 18   Ht 5\' 10"  (1.778 m)   Wt 95.5 kg (210 lb 8.6 oz)   SpO2 96%   BMI 30.21 kg/m  Vital signs in last 24 hours: Temp:  [97.8 F (36.6 C)] 97.8 F (36.6 C) (12/25 1235) Pulse Rate:  [65] 65 (12/25 1235) Resp:  [18] 18 (12/25 1235) BP: (146)/(57) 146/57 (12/25  1235) SpO2:  [92 %-100 %] 96 % (12/25 1235) Weight:  [95.5 kg (210 lb 8.6 oz)] 95.5 kg (210 lb 8.6 oz) (12/25 1201) General: Patient is awake, alert in no distress not following commands HEENT: Normocephalic, neck in c-collar which was brought in by EMS because he was found down, Lungs clear to auscultation everywhere with the exception of right upper lobe Extremities: 1+ edema Neurological exam Patient is awake, alert, does not follow any commands. He is mute. Cranial nerves: Pupils equal round reactive to light, left gaze preference-able to come to midline but cannot gaze to the right, no blink to threat from the right, right facial droop of the lower face. Motor exam: Flaccid right upper extremity, minimal movement to noxious stimulus on the right lower extremity initially but then he was spontaneously moving the right lower extremity, nearly full strength on both left upper and lower extremity. Sensory exam: Dense sensory loss on the right to noxious edematous. Coordination cannot be assessed Gait exam was deferred DTRs: 1+ at the biceps, Babinski positive on the right, downgoing on the left. NIH stroke scale 26  Labs I have reviewed labs in epic and the results pertinent to this consultation are: CBC    Component Value Date/Time   WBC 6.4 10/25/2017 1151   RBC 3.63 (L) 11/01/2017 1151   HGB 11.6 (L) 11/17/2017 1202   HGB 11.0 (L) 07/28/2016 1042   HCT 34.0 (L) 11/10/2017 1202   HCT 35.1 (L) 07/28/2016 1042   PLT PENDING 11/13/2017 1151   PLT 120 (L) 07/28/2016 1042   MCV 93.1 10/25/2017 1151   MCV 94 07/28/2016 1042   MCV 93 10/03/2013 0411   MCH 29.8 10/25/2017 1151   MCHC 32.0 10/22/2017 1151   RDW 14.2 11/12/2017 1151   RDW 12.6 07/28/2016 1042   RDW 12.7 10/03/2013 0411   LYMPHSABS 1.0 10/31/2017 1151   LYMPHSABS 1.6 07/28/2016 1042   LYMPHSABS 0.8 (L) 10/03/2013 0411   MONOABS 1.4 (H) 10/22/2017 1151   MONOABS 1.7 (H) 10/03/2013 0411   EOSABS 0.0 10/31/2017 1151    EOSABS 0.0 07/28/2016 1042   EOSABS 0.0 10/03/2013 0411   BASOSABS 0.0 10/25/2017 1151   BASOSABS 0.0 07/28/2016 1042   BASOSABS 0.0 10/03/2013 0411  CMP     Component Value Date/Time   NA 147 (H) 10/21/2017 1202   NA 141 07/28/2016 1042   NA 139 10/03/2013 0411   K 3.5 10/27/2017 1202   K 3.8 10/03/2013 0411   CL 110 10/29/2017 1202   CL 105 10/03/2013 0411   CO2 23 10/27/2017 1151   CO2 27 10/03/2013 0411   GLUCOSE 188 (H) 11/03/2017 1202   GLUCOSE 229 (H) 10/03/2013 0411   BUN  31 (H) 12/03/2017 1202   BUN 20 07/28/2016 1042   BUN 9 10/03/2013 0411   CREATININE 2.60 (H) 12/14/2017 1202   CREATININE 1.87 (H) 09/27/2017 0915   CALCIUM 8.7 (L) 12/08/2017 1151   CALCIUM 8.3 (L) 10/03/2013 0411   PROT 7.4 12/07/2017 1151   PROT 7.5 03/27/2017 0946   PROT 6.7 10/03/2013 0411   ALBUMIN 3.0 (L) 12/13/2017 1151   ALBUMIN 4.1 03/27/2017 0946   ALBUMIN 3.1 (L) 10/03/2013 0411   AST 108 (H) 12/06/2017 1151   AST 19 10/03/2013 0411   ALT 152 (H) 11/30/2017 1151   ALT 17 10/03/2013 0411   ALKPHOS 112 11/28/2017 1151   ALKPHOS 82 10/03/2013 0411   BILITOT 1.3 (H) 12/11/2017 1151   BILITOT 0.5 03/27/2017 0946   BILITOT 0.5 10/03/2013 0411   GFRNONAA 20 (L) 21-Nov-2017 1151   GFRNONAA 32 (L) 09/27/2017 0915   GFRAA 24 (L) November 21, 2017 1151   GFRAA 37 (L) 09/27/2017 0915  Lipid Panel     Component Value Date/Time   CHOL 144 09/27/2017 0915   CHOL 165 03/27/2017 0946   TRIG 84 09/27/2017 0915   HDL 31 (L) 09/27/2017 0915   HDL 31 (L) 03/27/2017 0946   CHOLHDL 4.6 09/27/2017 0915   LDLCALC 111 (H) 03/27/2017 0946   Imaging I have reviewed the images obtained: CT-scan of the brain -hypodensity in the left MCA territory less than one third of the territory, aspect score 7. CT angiogram head and neck shows left ICA and left MCA occlusion.  No collateral flow from the right side to the left. 75-90% stenosis of the proximal left ICA. High-grade stenosis of the right ICA with no  evidence of embolus distally in the MCAs on the right. Intracranial atherosclerosis generalized. CT perfusion shows a large score of 118 cc in the left MCA territory with a penumbra/core ratio of 1.5.  Assessment:  83 year old man with a past medical history as specified above presented for evaluation of right-sided weakness, aphasia and leftward gaze. His exam is consistent with a complete left MCA syndrome with an NIH stroke scale of 26. Outside the window for IV TPA since last known normal was 11:30 PM last night. Has a large vessel occlusion but not a candidate for endovascular therapy because of the large volume core in the left MCA territory. At this time, does have high-grade right ICA stenosis but would not recommend any acute intervention at this time. Discussed in detail with the family.   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 -Depending on whether he is intubated or not, admit to hospitalist or intensivist  -Telemetry monitoring -Allow for permissive hypertension for the first 24-48h - only treat PRN if SBP >220 mmHg. Blood pressures can be gradually normalized to SBP<140 upon discharge. -MRI brain without contrast -Echocardiogram -HgbA1c, fasting lipid panel -Frequent neuro checks -Prophylactic therapy-Antiplatelet med: Aspirin - dose 325mg  PO or 300mg  PR -Atorvastatin 80 mg PO daily -Risk factor modification -PT consult, OT consult, Speech consult  I discussed in detail with the patient's daughters and grandsons at bedside that this is a large stroke and it will probably leave him with significant disability given the etiology, vascular territory and the size of the stroke. The family understands the seriousness of the situation that he is in but they remain hopeful that he  will make a full recovery and want  him to remain a full code.  Please page stroke NP/PA/MD (listed on AMION)  from 8am-4 pm as this patient will be followed by the stroke team at this point.  -- Milon Dikes, MD [facility] [phone] If 7pm to 7am, please call on call as listed on AMION.  CRITICAL CARE Performed by: Milon Dikes Total critical care time: 55 minutes Critical care time was exclusive of separately billable procedures and treating other patients. Critical care was necessary to treat or prevent imminent or life-threatening deterioration. Critical care was time spent personally by me on the following activities: development of treatment plan with patient and/or surrogate as well as nursing, discussions with consultants, evaluation of patient's response to treatment, examination of patient, obtaining history from patient or surrogate, ordering and performing treatments and interventions, ordering and review of laboratory studies, ordering and review of radiographic studies, pulse oximetry and re-evaluation of patient's condition.

## 2017-11-14 NOTE — ED Notes (Signed)
Attempted report x1. 

## 2017-11-14 NOTE — ED Notes (Addendum)
Per EMS, LSN 1230am this morning, pt was called by family at 0900 and did not answer then son found pt laying in the floor unresponsive. Pt is flaccid on right side, with right side facial droop, left side gaze. Only verbal response is to pain, speech incomprehensible. Airway intact. VSS. CBG 211. Pt lives alone.

## 2017-11-14 NOTE — H&P (Signed)
History and Physical  Aaron Rivera WUJ:[id number] DOB: Nov 21, 1930 DOA: 11/17/2017  Referring physician: Dr. Ethelda Chick PCP: Maple Hudson., MD  Outpatient Specialists: gastroenterology ([facility]) Patient coming from:Home At his baseline ambulates independently  Chief Complaint: found down, unresponsive   HPI: Aaron Rivera is a 83 y.o. male with medical history significant for hypertension, CAD, hyperlipidemia, AAA, and diabetes  who presents on 11/02/2017 after being found down by family for unknown period of time.  Patient presents to the ED as a code stroke.  Patient's last known normal was the night prior to admission.  Per family patient was in his usual state of health.  Of note patient was recently hospitalized for new onset CHF and associated demand ischemia.  History obtained per chart review as no family at bedside and patient unable to provide history given severity of stroke.   ED Course: Arrived afebrile, blood pressure 146 and 57 otherwise he dynamically stable.  Lab workup significant for sodium 147, creatinine 2.6, hemoglobin 11.6, troponin 0 0.10, glucose 173.  Given presenting symptom and left foot gaze preference code stroke was called.   CTA head: Emergent large vessel occlusion of left ICA left MCA territory.   CTA neck: 90% high-grade stenosis of right ICA, 75-90% stenosis of proximal left ICA.  CT perfusion: Large core MCA territory infarct, moderate penumbra surrounding cord area of ischemia.  Chest x-ray: R greater than L airspace opacities concerning for multilobular pneumonia or aspiration pneumonia.  Review of Systems:As mentioned in the history of present illness.Review of systems are otherwise negative Patient seen in the ed    Past Medical History:  Diagnosis Date  . CHF (congestive heart failure) (HCC)   . Diabetes mellitus without complication (HCC)   . GERD (gastroesophageal reflux disease)   . Glaucoma    left eye   . Hypertension      Past Surgical History:  Procedure Laterality Date  . ABDOMINAL AORTIC ANEURYSM REPAIR    . cataract surgert Bilateral 2018    Social History:  reports that he quit smoking about 40 years ago. His smoking use included cigarettes. He has a 40.00 pack-year smoking history. He quit smokeless tobacco use about 48 years ago. He reports that he does not drink alcohol or use drugs.   No Known Allergies  Family History  Problem Relation Age of Onset  . Heart disease Mother   . Breast cancer Sister   . Prostate cancer Brother   . Hypertension Brother   . Hypertension Brother       Prior to Admission medications   Medication Sig Start Date End Date Taking? Authorizing Provider  captopril (CAPOTEN) 25 MG tablet TAKE 1/2 TABLET BY MOUTH TWICE A DAY 01/10/17   Maple Hudson., MD  carvedilol (COREG) 12.5 MG tablet Take 1 tablet (12.5 mg total) by mouth 2 (two) times daily with a meal. 11/04/17   Enedina Finner, MD  diltiazem (CARDIZEM CD) 300 MG 24 hr capsule Take 300 mg by mouth daily. 09/25/17   [provider]  furosemide (LASIX) 40 MG tablet Take 1 tablet (40 mg total) by mouth daily. Patient not taking: Reported on 11/07/2017 11/04/17   Enedina Finner, MD  KLOR-CON 10 10 MEQ tablet TAKE 2 TABLETS EVERY DAY Patient not taking: Reported on 11/07/2017 10/11/17   Maple Hudson., MD  latanoprost (XALATAN) 0.005 % ophthalmic solution Place 1 drop into both eyes.  09/16/17   [provider]  Lifitegrast Benay Spice) 5 %  SOLN  10/16/17   [provider]  omeprazole (PRILOSEC) 20 MG capsule Take 1 capsule (20 mg total) daily as needed by mouth. 09/27/17   Maple Hudson., MD  [facility] VERIO test strip USE 2 TIMES A DAY AS INSTRUCTED 02/27/17   Maple Hudson., MD  pioglitazone (ACTOS) 15 MG tablet TAKE 1 TABLET (15 MG TOTAL) BY MOUTH EVERY MORNING. Patient not taking: Reported on 11/02/2017 09/14/17   Maple Hudson., MD  ranitidine (ZANTAC) 150  MG tablet Take 150 mg by mouth at bedtime. 10/11/17   [provider]  rosuvastatin (CRESTOR) 20 MG tablet Take 1 tablet (20 mg total) by mouth daily. 12/12/16   Maple Hudson., MD  terazosin (HYTRIN) 2 MG capsule TAKE 1 CAPSULE (2 MG TOTAL) BY MOUTH DAILY. 10/16/17   [provider]    Physical Exam: BP (!) 141/57   Pulse 62   Temp 97.8 F (36.6 C) (Temporal)   Resp 18   Ht 5\' 10"  (1.778 m)   Wt 95.5 kg (210 lb 8.6 oz)   SpO2 99%   BMI 30.21 kg/m   General: Lying in bed in Appear in  no distress Eyes: EOMI, anicteric ENT: Oral Mucosa clear dry. Neck: normal, supple, no masses, no JVD Cardiovascular: regular rate and rhythm, no Murmurs, rubs or gallops. Peripheral Pulses present.  No edema Respiratory: Normal respiratory effort, clear breath sounds on auscultation bilaterally, no rales wheezes or rhonchi Abdomen: soft, non-distended, non-tender, normal bowel sounds, no guarding or rebound tenderness Skin:  No Rash Musculoskeletal:,  No clubbing / cyanosis. No joint deformity upper and lower extremities. Good ROM, no contractures. Normal muscle tone Neurologic: leftward gaze preference, nods head inappropriately in conversation, leftward gaze preference, attempts to grasp with left upper extremity, no movement of right side          Labs on Admission:  Basic Metabolic Panel: Recent Labs  Lab 2017/12/07 1151 2017-12-07 1202  NA 143 147*  K 3.5 3.5  CL 108 110  CO2 23  --   GLUCOSE 188* 188*  BUN 32* 31*  CREATININE 2.66* 2.60*  CALCIUM 8.7*  --    Liver Function Tests: Recent Labs  Lab 12/07/17 1151  AST 108*  ALT 152*  ALKPHOS 112  BILITOT 1.3*  PROT 7.4  ALBUMIN 3.0*   No results for input(s): LIPASE, AMYLASE in the last 168 hours. No results for input(s): AMMONIA in the last 168 hours. CBC: Recent Labs  Lab December 07, 2017 1151 12/07/17 1202  WBC 6.4  --   NEUTROABS 4.0  --   HGB 10.8* 11.6*  HCT 33.8* 34.0*  MCV 93.1  --   PLT 101*   --    Cardiac Enzymes: No results for input(s): CKTOTAL, CKMB, CKMBINDEX, TROPONINI in the last 168 hours.  BNP (last 3 results) Recent Labs    11/02/17 0944  BNP 509.0*    ProBNP (last 3 results) No results for input(s): PROBNP in the last 8760 hours.  CBG: Recent Labs  Lab 12/07/17 1153  GLUCAP 173*    Radiological Exams on Admission: Ct Angio Head W Or Wo Contrast  Result Date: December 07, 2017 CLINICAL DATA:  RIGHT-sided weakness.  Found down. EXAM: CT ANGIOGRAPHY HEAD AND NECK TECHNIQUE: Multidetector CT imaging of the head and neck was performed using the standard protocol during bolus administration of intravenous contrast. Multiplanar CT image reconstructions and MIPs were obtained to evaluate the vascular anatomy. Carotid stenosis measurements (when applicable) are  obtained utilizing NASCET criteria, using the distal internal carotid diameter as the denominator. CONTRAST:  50mL ISOVUE-370 IOPAMIDOL (ISOVUE-370) INJECTION 76% COMPARISON:  CT perfusion reported earlier. FINDINGS: CTA NECK Aortic arch: Standard branching. Imaged portion shows no evidence of aneurysm or dissection. No significant stenosis of the major arch vessel origins. Transverse arch calcifications. Right carotid system: High-grade stenosis, estimated 90% or greater in the proximal internal carotid artery due to a combination of calcific plaque and posterior wall soft plaque. There maybe intraluminal thrombus, see image 182 series 6. Left carotid system: Estimated 75-90% stenosis due to combination of calcific and posterior wall soft plaque. There is poor enhancement of the distal cervical ICA due to the intracranial vascular occlusion. Vertebral arteries: Codominant. BILATERAL ostial calcific plaque, 75% or greater. Nonvascular soft tissues: RIGHT upper lobe consolidation, suspected aspiration pneumonia. Large RIGHT pleural effusion. Advanced spondylosis. CTA HEAD Anterior circulation: Calcification of the cavernous  internal carotid arteries consistent with cerebrovascular atherosclerotic disease. There is occlusion of the LEFT internal carotid artery at the skullbase, extending to the ICA terminus. No significant filling of the LEFT M1 MCA, LEFT M2 bifurcation segments, or LEFT M3 branches over the convexity. Calcific 50-75% stenosis of the cavernous ICA on the RIGHT. No RIGHT M1 or M2 flow-limiting stenosis. Both anterior cerebral arteries are patent, but attempts of RIGHT-to-LEFT collateral circulation appear ineffective. Posterior circulation: Both vertebral arteries as well as the basilar artery are patent. Attempts at collateral flow via the LEFT PCom appear ineffective. No significant stenosis, proximal occlusion, aneurysm, or vascular malformation. Venous sinuses: As permitted by contrast timing, patent. Anatomic variants: None of significance. Delayed phase:  Not performed. Review of the MIP images confirms the above findings IMPRESSION: Emergent large vessel occlusion of the LEFT ICA and LEFT MCA territory. No significant collateral flow to the LEFT hemisphere either RIGHT-to-LEFT or from the posterior circulation. High-grade stenosis of the RIGHT internal carotid artery, 90% or greater. Possible intraluminal thrombus (versus eccentric posterior wall soft plaque) without evidence for distal emboli. Estimated 75-90% stenosis Proximal LEFT internal carotid artery, above which there is poor opacification due to the intracranial occlusion. These results were called by telephone at the time of interpretation on 11-30-17 at 1:06 pm to Dr. Milon Dikes , who verbally acknowledged these results. Electronically Signed   By: Elsie Stain M.D.   On: 2017/11/30 13:07   Ct Angio Neck W Or Wo Contrast  Result Date: 11-30-17 CLINICAL DATA:  RIGHT-sided weakness.  Found down. EXAM: CT ANGIOGRAPHY HEAD AND NECK TECHNIQUE: Multidetector CT imaging of the head and neck was performed using the standard protocol during bolus  administration of intravenous contrast. Multiplanar CT image reconstructions and MIPs were obtained to evaluate the vascular anatomy. Carotid stenosis measurements (when applicable) are obtained utilizing NASCET criteria, using the distal internal carotid diameter as the denominator. CONTRAST:  50mL ISOVUE-370 IOPAMIDOL (ISOVUE-370) INJECTION 76% COMPARISON:  CT perfusion reported earlier. FINDINGS: CTA NECK Aortic arch: Standard branching. Imaged portion shows no evidence of aneurysm or dissection. No significant stenosis of the major arch vessel origins. Transverse arch calcifications. Right carotid system: High-grade stenosis, estimated 90% or greater in the proximal internal carotid artery due to a combination of calcific plaque and posterior wall soft plaque. There maybe intraluminal thrombus, see image 182 series 6. Left carotid system: Estimated 75-90% stenosis due to combination of calcific and posterior wall soft plaque. There is poor enhancement of the distal cervical ICA due to the intracranial vascular occlusion. Vertebral arteries: Codominant. BILATERAL ostial calcific plaque,  75% or greater. Nonvascular soft tissues: RIGHT upper lobe consolidation, suspected aspiration pneumonia. Large RIGHT pleural effusion. Advanced spondylosis. CTA HEAD Anterior circulation: Calcification of the cavernous internal carotid arteries consistent with cerebrovascular atherosclerotic disease. There is occlusion of the LEFT internal carotid artery at the skullbase, extending to the ICA terminus. No significant filling of the LEFT M1 MCA, LEFT M2 bifurcation segments, or LEFT M3 branches over the convexity. Calcific 50-75% stenosis of the cavernous ICA on the RIGHT. No RIGHT M1 or M2 flow-limiting stenosis. Both anterior cerebral arteries are patent, but attempts of RIGHT-to-LEFT collateral circulation appear ineffective. Posterior circulation: Both vertebral arteries as well as the basilar artery are patent. Attempts at  collateral flow via the LEFT PCom appear ineffective. No significant stenosis, proximal occlusion, aneurysm, or vascular malformation. Venous sinuses: As permitted by contrast timing, patent. Anatomic variants: None of significance. Delayed phase:  Not performed. Review of the MIP images confirms the above findings IMPRESSION: Emergent large vessel occlusion of the LEFT ICA and LEFT MCA territory. No significant collateral flow to the LEFT hemisphere either RIGHT-to-LEFT or from the posterior circulation. High-grade stenosis of the RIGHT internal carotid artery, 90% or greater. Possible intraluminal thrombus (versus eccentric posterior wall soft plaque) without evidence for distal emboli. Estimated 75-90% stenosis Proximal LEFT internal carotid artery, above which there is poor opacification due to the intracranial occlusion. These results were called by telephone at the time of interpretation on 11/08/2017 at 1:06 pm to Dr. Milon DikesASHISH ARORA , who verbally acknowledged these results. Electronically Signed   By: Elsie StainJohn T Curnes M.D.   On: 11/11/2017 13:07   Ct Cervical Spine Wo Contrast  Result Date: 10/30/2017 CLINICAL DATA:  RIGHT-sided weakness. Symptom duration unspecified. Concern for acute infarction. EXAM: CT HEAD WITHOUT CONTRAST CT CERVICAL SPINE WITHOUT CONTRAST TECHNIQUE: Multidetector CT imaging of the head and cervical spine was performed following the standard protocol without intravenous contrast. Multiplanar CT image reconstructions of the cervical spine were also generated. COMPARISON:  MRI cervical spine 06/30/2017. FINDINGS: CT HEAD FINDINGS Brain: Multifocal areas of cortical hypoattenuation on the LEFT, as well as loss of the insular ribbon, concerning for acute infarction. No hemorrhage, mass lesion, or extra-axial fluid. Generalized atrophy with hydrocephalus ex vacuo. Vascular: Large vessel occlusion, with dense LEFT ICA and LEFT M1/M2 MCA branches. Skull: Normal. Negative for fracture or focal  lesion. Sinuses/Orbits: No acute finding. Other: None. ASPECTS score = 6. Cytotoxic edema of the insula, M2, possible M4 and M6 zones. [place] Stroke Program Early CT Score Normal score = 10 CT CERVICAL SPINE FINDINGS Alignment: Anatomic Skull base and vertebrae: No acute fracture. No primary bone lesion or focal pathologic process. Soft tissues and spinal canal: Severe stenosis at C3-4, combination of OPLL and central disc herniation with posterior element hypertrophy. No visible canal hemorrhage. Disc levels: Multilevel spondylosis. This is better evaluated with prior MRI cervical. Upper chest: Dense opacity in the RIGHT upper lobe, favoring consolidation, query aspiration. No similar findings on the LEFT to suggest asymmetric edema. RIGHT pleural effusion. Other: None. IMPRESSION: CT head revealing emergent large vessel occlusion affecting the LEFT ICA and LEFT M1/M2 MCA. Code stroke ASPECTS score of 6.  CT perfusion to follow. Cervical spondylosis, with multilevel degenerative change and stenosis, which appears similar to prior MR cervical of 06/30/2017. Suspected RIGHT lung aspiration pneumonia.  Large RIGHT effusion. These results were called by telephone at the time of interpretation on 10/25/2017 at 12:20 pm to Dr. Wilford CornerARORA, who verbally acknowledged these results. Electronically Signed  By: Elsie Stain M.D.   On: 2017/12/08 12:34   Ct Pelvis Wo Contrast  Result Date: 12-08-2017 CLINICAL DATA:  Patient found lying on floor on right side this morning unresponsive. Right hip pain. EXAM: CT PELVIS WITHOUT CONTRAST TECHNIQUE: Multidetector CT imaging of the pelvis was performed following the standard protocol without intravenous contrast. COMPARISON:  09/05/2013 FINDINGS: Urinary Tract:  Ureters and bladder are within normal. Bowel: Mild diverticulosis of the colon. Appendix is normal. Visualized small bowel is normal. Vascular/Lymphatic: Aortoiliac stent graft exclude a native abdominal aneurysm measuring  6.8 x 7 cm in AP and transverse dimension unchanged. Moderate calcified plaque over the iliac and femoral arteries. No adenopathy. Reproductive:  Within normal. Other:  No free fluid or focal inflammatory change. Musculoskeletal: Mild degenerate change of the spine and hips. No evidence of acute hip fracture or dislocation. IMPRESSION: No acute fracture. Aortoiliac stent graft excluding a native abdominal aortic aneurysm measuring 6.8 x 7 cm unchanged. Diverticulosis of the colon. Electronically Signed   By: Elberta Fortis M.D.   On: Dec 08, 2017 12:50   Ct Cerebral Perfusion W Contrast  Result Date: 2017-12-08 CLINICAL DATA:  RIGHT-sided weakness, found down.Last seen normal 12:30 a.m. earlier today. EXAM: CT PERFUSION BRAIN TECHNIQUE: Multiphase CT imaging of the brain was performed following IV bolus contrast injection. Subsequent parametric perfusion maps were calculated using RAPID software. CONTRAST:  40mL ISOVUE-370 IOPAMIDOL (ISOVUE-370) INJECTION 76% COMPARISON:  Code stroke CT earlier today. CTA head neck reported separately. FINDINGS: CT Brain Perfusion Findings: CBF (<30%) Volume: Perfusion (Tmax>6.0s) volume: Mismatch Volume: 60mL Infarction Location:A large portion of the LEFT MCA territory, including frontal, temporal, insula, and anterior parietal lobes, LEFT basal ganglia, and adjacent white matter. IMPRESSION: CT brain perfusion findings indicating a large core MCA territory infarct of 118 mL. There is a moderate penumbra of 60 mL surrounding the core area of ischemia. See discussion above. These results were called by telephone at the time of interpretation on 12-08-17 at 12:32 pm to Dr. Milon Dikes , who verbally acknowledged these results. Electronically Signed   By: Elsie Stain M.D.   On: 2017/12/08 12:39   Dg Chest Port 1 View  Result Date: 08-Dec-2017 CLINICAL DATA:  Airspace opacity at the right lung apex for further characterization. EXAM: PORTABLE CHEST 1 VIEW  COMPARISON:  2017/12/08 CT neck FINDINGS: Bilateral airspace opacities, right greater than left, with particular consolidation at the right lung apex and also in the right infrahilar region. Hazy density over the right hemithorax likely represents layering of the known right pleural effusion. Atherosclerotic calcification of the aortic arch. Mild cardiomegaly. Streaky left perihilar and basilar opacities. IMPRESSION: 1. Right greater than left airspace opacities especially in the infrahilar and right apical regions, possibilities include asymmetric edema, multilobar pneumonia, and aspiration pneumonia. 2. Lesser streaky opacities in the left perihilar region and left lower lobe. 3. Mild enlargement of the cardiopericardial silhouette. 4. Known layering right pleural effusion. 5.  Aortic Atherosclerosis (ICD10-I70.0). Electronically Signed   By: Gaylyn Rong M.D.   On: 2017-12-08 13:36   Ct Head Code Stroke Wo Contrast  Result Date: 08-Dec-2017 CLINICAL DATA:  RIGHT-sided weakness. Symptom duration unspecified. Concern for acute infarction. EXAM: CT HEAD WITHOUT CONTRAST CT CERVICAL SPINE WITHOUT CONTRAST TECHNIQUE: Multidetector CT imaging of the head and cervical spine was performed following the standard protocol without intravenous contrast. Multiplanar CT image reconstructions of the cervical spine were also generated. COMPARISON:  MRI cervical spine 06/30/2017. FINDINGS: CT HEAD FINDINGS Brain: Multifocal  areas of cortical hypoattenuation on the LEFT, as well as loss of the insular ribbon, concerning for acute infarction. No hemorrhage, mass lesion, or extra-axial fluid. Generalized atrophy with hydrocephalus ex vacuo. Vascular: Large vessel occlusion, with dense LEFT ICA and LEFT M1/M2 MCA branches. Skull: Normal. Negative for fracture or focal lesion. Sinuses/Orbits: No acute finding. Other: None. ASPECTS score = 6. Cytotoxic edema of the insula, M2, possible M4 and M6 zones. [place] Stroke Program  Early CT Score Normal score = 10 CT CERVICAL SPINE FINDINGS Alignment: Anatomic Skull base and vertebrae: No acute fracture. No primary bone lesion or focal pathologic process. Soft tissues and spinal canal: Severe stenosis at C3-4, combination of OPLL and central disc herniation with posterior element hypertrophy. No visible canal hemorrhage. Disc levels: Multilevel spondylosis. This is better evaluated with prior MRI cervical. Upper chest: Dense opacity in the RIGHT upper lobe, favoring consolidation, query aspiration. No similar findings on the LEFT to suggest asymmetric edema. RIGHT pleural effusion. Other: None. IMPRESSION: CT head revealing emergent large vessel occlusion affecting the LEFT ICA and LEFT M1/M2 MCA. Code stroke ASPECTS score of 6.  CT perfusion to follow. Cervical spondylosis, with multilevel degenerative change and stenosis, which appears similar to prior MR cervical of 06/30/2017. Suspected RIGHT lung aspiration pneumonia.  Large RIGHT effusion. These results were called by telephone at the time of interpretation on 01-Jan-2017 at 12:20 pm to Dr. Wilford CornerARORA, who verbally acknowledged these results. Electronically Signed   By: Elsie StainJohn T Curnes M.D.   On: 01-Jan-2017 12:34    Assessment/Plan Present on Admission: . AI (aortic incompetence) . AAA (abdominal aortic aneurysm) (HCC) . Essential (primary) hypertension . HLD (hyperlipidemia) . Carotid stenosis  Active Problems:   AAA (abdominal aortic aneurysm) (HCC)   AI (aortic incompetence)   Essential (primary) hypertension   HLD (hyperlipidemia)   Diabetes mellitus, type 2 (HCC)   Carotid stenosis   Acute ischemic left MCA stroke (HCC)   Chronic systolic CHF (congestive heart failure) (HCC)   Left MCA stroke Cardioembolic versus atheroembolic.  Carotid imaging shows severe right ICA stenosis (75-90% stenotic) with no evidence of embolus distally in the right MCAs.  Outside window for TPA with last known normal greater than 12 hours.   Large vessel conclusion per neurology not a candidate for endovascular therapy -Neurology consulted:  obtain A1c, fasting lipid panel, TTE, MRI brain without contrast, frequent neuro checks -Aspirin 300 per rectum (awaiting speech evaluation) -Speech therapy consulted (n.p.o. currently) -PT and OT consulted  Multifocal pneumonia.  Chest x-ray shows right greater than left airspace opacities with concern for multilobar pneumonia versus aspiration pneumonia.  Given large area of stroke high risk for aspiration.  Also pleural layering right pleural effusion. -Empiric IV Vancocin Zosyn -Supple oxygen as needed -Plan to de-escalate if remains afebrile for the next 24 hours  -If spikes fevers obtain blood cultures  Hypertension.  Stable.  Will allow permissive hypertension for the first 24-40 hours per neurology recommendations (only treat if SBP greater than 228) -Holding all home pressure medications -IV hydralazine as needed for above blood pressure parameters  Chronic systolic CHF, euvolemic on exam -Holding home diuretics to prevent any further lowering of blood pressure. -Continue to monitor, daily weights, monitor fluid output  Hyperlipidemia - Continue home statin  Depression, stable - Continue home meds:    DVT prophylaxis: Subcutaneous Heparin  Code Status: Full Code  Family Communication: No family was present at bedside and updated appropriately at the time of interview.   Disposition Plan:  f/u risk factor work up and MRI brain imaging, infection work up  [employer] called: Neurology   Admission status: Admitted as inpatient telemetry unit.      Laverna Peace MD [facility]  Pager [phone]  If 7PM-7AM, please contact night-coverage [email or web address] Password [email or web address]  2017/11/29, 2:15 PM

## 2017-11-14 NOTE — ED Provider Notes (Signed)
[facility]   CSN: [id number] Arrival date & time: 10/25/2017  1150  leve caveat acuity of situation large cerebral artery vessel occlusion suspected History is obtained by EMS  History   Chief Complaint Chief Complaint  Patient presents with  . Code Stroke    HPI Sallye Oberhomas H Sanguinetti is a 83 y.o. male.  Patient was last normal at 12:30 AM today.  His son found him on the floor today less responsive.  EMS was called.  EMS noted patient to have leftward gaze and ignoring his right side.  No treatment prior to coming here.  HPI  Past Medical History:  Diagnosis Date  . CHF (congestive heart failure) (HCC)   . Diabetes mellitus without complication (HCC)   . GERD (gastroesophageal reflux disease)   . Glaucoma    left eye   . Hypertension     Patient Active Problem List   Diagnosis Date Noted  . Acute CHF (congestive heart failure) (HCC) 11/02/2017  . Vitamin D deficiency, unspecified 08/26/2017  . PAD (peripheral artery disease) (HCC) 03/20/2017  . Carotid stenosis 03/20/2017  . AAA (abdominal aortic aneurysm) (HCC) 06/16/2015  . Allergic rhinitis 06/16/2015  . AI (aortic incompetence) 06/16/2015  . Benign prostatic hypertrophy with lower urinary tract symptoms (LUTS) 06/16/2015  . Coronary artery abnormality 06/16/2015  . ED (erectile dysfunction) of organic origin 06/16/2015  . Essential (primary) hypertension 06/16/2015  . Acid reflux 06/16/2015  . HLD (hyperlipidemia) 06/16/2015  . Arthritis, degenerative 06/16/2015  . Allergic rhinitis, seasonal 06/16/2015  . Diabetes mellitus, type 2 (HCC) 06/16/2015    Past Surgical History:  Procedure Laterality Date  . ABDOMINAL AORTIC ANEURYSM REPAIR    . cataract surgert Bilateral 2018       Home Medications    Prior to Admission medications   Medication Sig Start Date End Date Taking? Authorizing Provider  captopril (CAPOTEN) 25 MG tablet TAKE 1/2 TABLET BY MOUTH  TWICE A DAY 01/10/17   Maple HudsonGilbert, Richard L Jr., MD  carvedilol (COREG) 12.5 MG tablet Take 1 tablet (12.5 mg total) by mouth 2 (two) times daily with a meal. 11/04/17   Enedina FinnerPatel, Sona, MD  diltiazem (CARDIZEM CD) 300 MG 24 hr capsule Take 300 mg by mouth daily. 09/25/17   [provider]  furosemide (LASIX) 40 MG tablet Take 1 tablet (40 mg total) by mouth daily. Patient not taking: Reported on 11/07/2017 11/04/17   Enedina FinnerPatel, Sona, MD  KLOR-CON 10 10 MEQ tablet TAKE 2 TABLETS EVERY DAY Patient not taking: Reported on 11/07/2017 10/11/17   Maple HudsonGilbert, Richard L Jr., MD  latanoprost (XALATAN) 0.005 % ophthalmic solution Place 1 drop into both eyes.  09/16/17   [provider]  Lifitegrast Benay Spice(XIIDRA) 5 % SOLN  10/16/17   [provider]  omeprazole (PRILOSEC) 20 MG capsule Take 1 capsule (20 mg total) daily as needed by mouth. 09/27/17   Maple HudsonGilbert, Richard L Jr., MD  [facility] test strip USE 2 TIMES A DAY AS INSTRUCTED 02/27/17   Maple HudsonGilbert, Richard L Jr., MD  pioglitazone (ACTOS) 15 MG tablet TAKE 1 TABLET (15 MG TOTAL) BY MOUTH EVERY MORNING. Patient not taking: Reported on 11/02/2017 09/14/17   Maple HudsonGilbert, Richard L Jr., MD  ranitidine (ZANTAC) 150 MG tablet Take 150 mg by mouth at bedtime. 10/11/17   [provider]  rosuvastatin (CRESTOR) 20 MG tablet Take 1 tablet (20 mg total) by mouth daily. 12/12/16   Maple HudsonGilbert, Richard L Jr., MD  terazosin (  HYTRIN) 2 MG capsule TAKE 1 CAPSULE (2 MG TOTAL) BY MOUTH DAILY. 10/16/17   [provider]    Family History Family History  Problem Relation Age of Onset  . Heart disease Mother   . Breast cancer Sister   . Prostate cancer Brother   . Hypertension Brother   . Hypertension Brother     Social History Social History   Tobacco Use  . Smoking status: Former Smoker    Packs/day: 1.00    Years: 40.00    Pack years: 40.00    Types: Cigarettes    Last attempt to quit: 11/07/1977    Years since quitting: 40.0  .  Smokeless tobacco: Former NeurosurgeonUser    Quit date: 11/20/1969  Substance Use Topics  . Alcohol use: No    Alcohol/week: 0.0 oz  . Drug use: No     Allergies   Patient has no known allergies.   Review of Systems Review of Systems  Unable to perform ROS: Mental status change     Physical Exam Updated Vital Signs Ht 5\' 10"  (1.778 m)   Wt 95.5 kg (210 lb 8.6 oz)   SpO2 92%   BMI 30.21 kg/m   Physical Exam  Constitutional: He appears well-developed and well-nourished.  Right-sided facial droop  HENT:  Head: Normocephalic and atraumatic.  Eyes:  Eyes deviated to the left  Neck: Neck supple. No tracheal deviation present. No thyromegaly present.  Cardiovascular: Normal rate and regular rhythm.  No murmur heard. Pulmonary/Chest: Effort normal and breath sounds normal.  Abdominal: Soft. Bowel sounds are normal. He exhibits no distension. There is no tenderness.  Musculoskeletal: Normal range of motion. He exhibits no edema or tenderness.  Neurological: He is alert. Coordination normal.  Gag reflex present.  No spontaneous movement of extremities.  Right-sided hemiplegia.  Spontaneous movement of left upper extremity and left lower extremity.  DTRs symmetric bilaterally at knee jerk ankle jerk and biceps right toe upgoing left toe downward going  Skin: Skin is warm and dry. No rash noted.  Psychiatric: He has a normal mood and affect.  Nursing note and vitals reviewed.    ED Treatments / Results  Labs (all labs ordered are listed, but only abnormal results are displayed) Labs Reviewed  PROTIME-INR - Abnormal; Notable for the following components:      Result Value   Prothrombin Time 17.9 (*)    All other components within normal limits  CBC - Abnormal; Notable for the following components:   RBC 3.63 (*)    Hemoglobin 10.8 (*)    HCT 33.8 (*)    All other components within normal limits  DIFFERENTIAL - Abnormal; Notable for the following components:   Monocytes Absolute  1.4 (*)    All other components within normal limits  COMPREHENSIVE METABOLIC PANEL - Abnormal; Notable for the following components:   Glucose, Bld 188 (*)    BUN 32 (*)    Creatinine, Ser 2.66 (*)    Calcium 8.7 (*)    Albumin 3.0 (*)    AST 108 (*)    ALT 152 (*)    Total Bilirubin 1.3 (*)    GFR calc non Af Amer 20 (*)    GFR calc Af Amer 24 (*)    All other components within normal limits  CBG MONITORING, ED - Abnormal; Notable for the following components:   Glucose-Capillary 173 (*)    All other components within normal limits  I-STAT TROPONIN, ED - Abnormal; Notable  for the following components:   Troponin i, poc 0.10 (*)    All other components within normal limits  I-STAT CHEM 8, ED - Abnormal; Notable for the following components:   Sodium 147 (*)    BUN 31 (*)    Creatinine, Ser 2.60 (*)    Glucose, Bld 188 (*)    Calcium, Ion 1.12 (*)    Hemoglobin 11.6 (*)    HCT 34.0 (*)    All other components within normal limits  APTT  CBG MONITORING, ED   Chest x-ray reviewed by me Results for orders placed or performed during the hospital encounter of 10/24/2017  Protime-INR  Result Value Ref Range   Prothrombin Time 17.9 (H) 11.4 - 15.2 seconds   INR 1.49   APTT  Result Value Ref Range   aPTT 32 24 - 36 seconds  CBC  Result Value Ref Range   WBC 6.4 4.0 - 10.5 K/uL   RBC 3.63 (L) 4.22 - 5.81 MIL/uL   Hemoglobin 10.8 (L) 13.0 - 17.0 g/dL   HCT 16.1 (L) 09.6 - 04.5 %   MCV 93.1 78.0 - 100.0 fL   MCH 29.8 26.0 - 34.0 pg   MCHC 32.0 30.0 - 36.0 g/dL   RDW 40.9 81.1 - 91.4 %   Platelets 101 (L) 150 - 400 K/uL  Differential  Result Value Ref Range   Neutrophils Relative % 62 %   Neutro Abs 4.0 1.7 - 7.7 K/uL   Lymphocytes Relative 16 %   Lymphs Abs 1.0 0.7 - 4.0 K/uL   Monocytes Relative 22 %   Monocytes Absolute 1.4 (H) 0.1 - 1.0 K/uL   Eosinophils Relative 0 %   Eosinophils Absolute 0.0 0.0 - 0.7 K/uL   Basophils Relative 0 %   Basophils Absolute 0.0 0.0 -  0.1 K/uL  Comprehensive metabolic panel  Result Value Ref Range   Sodium 143 135 - 145 mmol/L   Potassium 3.5 3.5 - 5.1 mmol/L   Chloride 108 101 - 111 mmol/L   CO2 23 22 - 32 mmol/L   Glucose, Bld 188 (H) 65 - 99 mg/dL   BUN 32 (H) 6 - 20 mg/dL   Creatinine, Ser 7.82 (H) 0.61 - 1.24 mg/dL   Calcium 8.7 (L) 8.9 - 10.3 mg/dL   Total Protein 7.4 6.5 - 8.1 g/dL   Albumin 3.0 (L) 3.5 - 5.0 g/dL   AST 956 (H) 15 - 41 U/L   ALT 152 (H) 17 - 63 U/L   Alkaline Phosphatase 112 38 - 126 U/L   Total Bilirubin 1.3 (H) 0.3 - 1.2 mg/dL   GFR calc non Af Amer 20 (L) >60 mL/min   GFR calc Af Amer 24 (L) >60 mL/min   Anion gap 12 5 - 15  CBG monitoring, ED  Result Value Ref Range   Glucose-Capillary 173 (H) 65 - 99 mg/dL   Comment 1 Notify RN    Comment 2 Document in Chart   I-stat troponin, ED  Result Value Ref Range   Troponin i, poc 0.10 (HH) 0.00 - 0.08 ng/mL   Comment NOTIFIED PHYSICIAN    Comment 3          I-Stat Chem 8, ED  Result Value Ref Range   Sodium 147 (H) 135 - 145 mmol/L   Potassium 3.5 3.5 - 5.1 mmol/L   Chloride 110 101 - 111 mmol/L   BUN 31 (H) 6 - 20 mg/dL   Creatinine, Ser 2.13 (H) 0.61 -  1.24 mg/dL   Glucose, Bld 161 (H) 65 - 99 mg/dL   Calcium, Ion 0.96 (L) 1.15 - 1.40 mmol/L   TCO2 23 22 - 32 mmol/L   Hemoglobin 11.6 (L) 13.0 - 17.0 g/dL   HCT 04.5 (L) 40.9 - 81.1 %   Ct Angio Head W Or Wo Contrast  Result Date: 11/02/2017 CLINICAL DATA:  RIGHT-sided weakness.  Found down. EXAM: CT ANGIOGRAPHY HEAD AND NECK TECHNIQUE: Multidetector CT imaging of the head and neck was performed using the standard protocol during bolus administration of intravenous contrast. Multiplanar CT image reconstructions and MIPs were obtained to evaluate the vascular anatomy. Carotid stenosis measurements (when applicable) are obtained utilizing NASCET criteria, using the distal internal carotid diameter as the denominator. CONTRAST:  50mL ISOVUE-370 IOPAMIDOL (ISOVUE-370) INJECTION 76%  COMPARISON:  CT perfusion reported earlier. FINDINGS: CTA NECK Aortic arch: Standard branching. Imaged portion shows no evidence of aneurysm or dissection. No significant stenosis of the major arch vessel origins. Transverse arch calcifications. Right carotid system: High-grade stenosis, estimated 90% or greater in the proximal internal carotid artery due to a combination of calcific plaque and posterior wall soft plaque. There maybe intraluminal thrombus, see image 182 series 6. Left carotid system: Estimated 75-90% stenosis due to combination of calcific and posterior wall soft plaque. There is poor enhancement of the distal cervical ICA due to the intracranial vascular occlusion. Vertebral arteries: Codominant. BILATERAL ostial calcific plaque, 75% or greater. Nonvascular soft tissues: RIGHT upper lobe consolidation, suspected aspiration pneumonia. Large RIGHT pleural effusion. Advanced spondylosis. CTA HEAD Anterior circulation: Calcification of the cavernous internal carotid arteries consistent with cerebrovascular atherosclerotic disease. There is occlusion of the LEFT internal carotid artery at the skullbase, extending to the ICA terminus. No significant filling of the LEFT M1 MCA, LEFT M2 bifurcation segments, or LEFT M3 branches over the convexity. Calcific 50-75% stenosis of the cavernous ICA on the RIGHT. No RIGHT M1 or M2 flow-limiting stenosis. Both anterior cerebral arteries are patent, but attempts of RIGHT-to-LEFT collateral circulation appear ineffective. Posterior circulation: Both vertebral arteries as well as the basilar artery are patent. Attempts at collateral flow via the LEFT PCom appear ineffective. No significant stenosis, proximal occlusion, aneurysm, or vascular malformation. Venous sinuses: As permitted by contrast timing, patent. Anatomic variants: None of significance. Delayed phase:  Not performed. Review of the MIP images confirms the above findings IMPRESSION: Emergent large vessel  occlusion of the LEFT ICA and LEFT MCA territory. No significant collateral flow to the LEFT hemisphere either RIGHT-to-LEFT or from the posterior circulation. High-grade stenosis of the RIGHT internal carotid artery, 90% or greater. Possible intraluminal thrombus (versus eccentric posterior wall soft plaque) without evidence for distal emboli. Estimated 75-90% stenosis Proximal LEFT internal carotid artery, above which there is poor opacification due to the intracranial occlusion. These results were called by telephone at the time of interpretation on 10/26/2017 at 1:06 pm to Dr. Milon Dikes , who verbally acknowledged these results. Electronically Signed   By: Elsie Stain M.D.   On: 11/02/2017 13:07   Dg Chest 2 View  Result Date: 11/02/2017 CLINICAL DATA:  Shortness of Breath EXAM: CHEST  2 VIEW COMPARISON:  September 05, 2013 FINDINGS: There is fibrotic change throughout the lungs bilaterally, greatest in the right upper lobe and basilar regions. Interstitium is somewhat more prominent than on prior CT suggests that there may be a superimposed degree of interstitial edema. There is a small right pleural effusion. There is cardiomegaly with pulmonary venous hypertension. No adenopathy. There  is aortic atherosclerosis. There is degenerative change in each shoulder. IMPRESSION: Suspect a degree of interstitial edema superimposed on fibrosis. There is a small right pleural effusion with cardiomegaly and pulmonary venous hypertension. Suspect a degree of congestive heart failure. There is aortic atherosclerosis. No adenopathy appreciable. Aortic Atherosclerosis (ICD10-I70.0). Electronically Signed   By: Bretta Bang III M.D.   On: 11/02/2017 10:11   Ct Angio Neck W Or Wo Contrast  Result Date: 11/15/2017 CLINICAL DATA:  RIGHT-sided weakness.  Found down. EXAM: CT ANGIOGRAPHY HEAD AND NECK TECHNIQUE: Multidetector CT imaging of the head and neck was performed using the standard protocol during bolus  administration of intravenous contrast. Multiplanar CT image reconstructions and MIPs were obtained to evaluate the vascular anatomy. Carotid stenosis measurements (when applicable) are obtained utilizing NASCET criteria, using the distal internal carotid diameter as the denominator. CONTRAST:  50mL ISOVUE-370 IOPAMIDOL (ISOVUE-370) INJECTION 76% COMPARISON:  CT perfusion reported earlier. FINDINGS: CTA NECK Aortic arch: Standard branching. Imaged portion shows no evidence of aneurysm or dissection. No significant stenosis of the major arch vessel origins. Transverse arch calcifications. Right carotid system: High-grade stenosis, estimated 90% or greater in the proximal internal carotid artery due to a combination of calcific plaque and posterior wall soft plaque. There maybe intraluminal thrombus, see image 182 series 6. Left carotid system: Estimated 75-90% stenosis due to combination of calcific and posterior wall soft plaque. There is poor enhancement of the distal cervical ICA due to the intracranial vascular occlusion. Vertebral arteries: Codominant. BILATERAL ostial calcific plaque, 75% or greater. Nonvascular soft tissues: RIGHT upper lobe consolidation, suspected aspiration pneumonia. Large RIGHT pleural effusion. Advanced spondylosis. CTA HEAD Anterior circulation: Calcification of the cavernous internal carotid arteries consistent with cerebrovascular atherosclerotic disease. There is occlusion of the LEFT internal carotid artery at the skullbase, extending to the ICA terminus. No significant filling of the LEFT M1 MCA, LEFT M2 bifurcation segments, or LEFT M3 branches over the convexity. Calcific 50-75% stenosis of the cavernous ICA on the RIGHT. No RIGHT M1 or M2 flow-limiting stenosis. Both anterior cerebral arteries are patent, but attempts of RIGHT-to-LEFT collateral circulation appear ineffective. Posterior circulation: Both vertebral arteries as well as the basilar artery are patent. Attempts at  collateral flow via the LEFT PCom appear ineffective. No significant stenosis, proximal occlusion, aneurysm, or vascular malformation. Venous sinuses: As permitted by contrast timing, patent. Anatomic variants: None of significance. Delayed phase:  Not performed. Review of the MIP images confirms the above findings IMPRESSION: Emergent large vessel occlusion of the LEFT ICA and LEFT MCA territory. No significant collateral flow to the LEFT hemisphere either RIGHT-to-LEFT or from the posterior circulation. High-grade stenosis of the RIGHT internal carotid artery, 90% or greater. Possible intraluminal thrombus (versus eccentric posterior wall soft plaque) without evidence for distal emboli. Estimated 75-90% stenosis Proximal LEFT internal carotid artery, above which there is poor opacification due to the intracranial occlusion. These results were called by telephone at the time of interpretation on 11/16/2017 at 1:06 pm to Dr. Milon Dikes , who verbally acknowledged these results. Electronically Signed   By: Elsie Stain M.D.   On: 11/17/2017 13:07   Ct Cervical Spine Wo Contrast  Result Date: 11/01/2017 CLINICAL DATA:  RIGHT-sided weakness. Symptom duration unspecified. Concern for acute infarction. EXAM: CT HEAD WITHOUT CONTRAST CT CERVICAL SPINE WITHOUT CONTRAST TECHNIQUE: Multidetector CT imaging of the head and cervical spine was performed following the standard protocol without intravenous contrast. Multiplanar CT image reconstructions of the cervical spine were also generated. COMPARISON:  MRI cervical spine 06/30/2017. FINDINGS: CT HEAD FINDINGS Brain: Multifocal areas of cortical hypoattenuation on the LEFT, as well as loss of the insular ribbon, concerning for acute infarction. No hemorrhage, mass lesion, or extra-axial fluid. Generalized atrophy with hydrocephalus ex vacuo. Vascular: Large vessel occlusion, with dense LEFT ICA and LEFT M1/M2 MCA branches. Skull: Normal. Negative for fracture or focal  lesion. Sinuses/Orbits: No acute finding. Other: None. ASPECTS score = 6. Cytotoxic edema of the insula, M2, possible M4 and M6 zones. [place] Stroke Program Early CT Score Normal score = 10 CT CERVICAL SPINE FINDINGS Alignment: Anatomic Skull base and vertebrae: No acute fracture. No primary bone lesion or focal pathologic process. Soft tissues and spinal canal: Severe stenosis at C3-4, combination of OPLL and central disc herniation with posterior element hypertrophy. No visible canal hemorrhage. Disc levels: Multilevel spondylosis. This is better evaluated with prior MRI cervical. Upper chest: Dense opacity in the RIGHT upper lobe, favoring consolidation, query aspiration. No similar findings on the LEFT to suggest asymmetric edema. RIGHT pleural effusion. Other: None. IMPRESSION: CT head revealing emergent large vessel occlusion affecting the LEFT ICA and LEFT M1/M2 MCA. Code stroke ASPECTS score of 6.  CT perfusion to follow. Cervical spondylosis, with multilevel degenerative change and stenosis, which appears similar to prior MR cervical of 06/30/2017. Suspected RIGHT lung aspiration pneumonia.  Large RIGHT effusion. These results were called by telephone at the time of interpretation on 11/09/2017 at 12:20 pm to Dr. Wilford Corner, who verbally acknowledged these results. Electronically Signed   By: Elsie Stain M.D.   On: 11/03/2017 12:34   Ct Pelvis Wo Contrast  Result Date: 10/25/2017 CLINICAL DATA:  Patient found lying on floor on right side this morning unresponsive. Right hip pain. EXAM: CT PELVIS WITHOUT CONTRAST TECHNIQUE: Multidetector CT imaging of the pelvis was performed following the standard protocol without intravenous contrast. COMPARISON:  09/05/2013 FINDINGS: Urinary Tract:  Ureters and bladder are within normal. Bowel: Mild diverticulosis of the colon. Appendix is normal. Visualized small bowel is normal. Vascular/Lymphatic: Aortoiliac stent graft exclude a native abdominal aneurysm measuring  6.8 x 7 cm in AP and transverse dimension unchanged. Moderate calcified plaque over the iliac and femoral arteries. No adenopathy. Reproductive:  Within normal. Other:  No free fluid or focal inflammatory change. Musculoskeletal: Mild degenerate change of the spine and hips. No evidence of acute hip fracture or dislocation. IMPRESSION: No acute fracture. Aortoiliac stent graft excluding a native abdominal aortic aneurysm measuring 6.8 x 7 cm unchanged. Diverticulosis of the colon. Electronically Signed   By: Elberta Fortis M.D.   On: 10/27/2017 12:50   Ct Cerebral Perfusion W Contrast  Result Date: 11/01/2017 CLINICAL DATA:  RIGHT-sided weakness, found down.Last seen normal 12:30 a.m. earlier today. EXAM: CT PERFUSION BRAIN TECHNIQUE: Multiphase CT imaging of the brain was performed following IV bolus contrast injection. Subsequent parametric perfusion maps were calculated using RAPID software. CONTRAST:  40mL ISOVUE-370 IOPAMIDOL (ISOVUE-370) INJECTION 76% COMPARISON:  Code stroke CT earlier today. CTA head neck reported separately. FINDINGS: CT Brain Perfusion Findings: CBF (<30%) Volume: Perfusion (Tmax>6.0s) volume: Mismatch Volume: 60mL Infarction Location:A large portion of the LEFT MCA territory, including frontal, temporal, insula, and anterior parietal lobes, LEFT basal ganglia, and adjacent white matter. IMPRESSION: CT brain perfusion findings indicating a large core MCA territory infarct of 118 mL. There is a moderate penumbra of 60 mL surrounding the core area of ischemia. See discussion above. These results were called by telephone at the time of interpretation on  10/27/2017 at 12:32 pm to Dr. Milon Dikes , who verbally acknowledged these results. Electronically Signed   By: Elsie Stain M.D.   On: 10/27/2017 12:39   Dg Chest Port 1 View  Result Date: 11/08/2017 CLINICAL DATA:  Airspace opacity at the right lung apex for further characterization. EXAM: PORTABLE CHEST 1 VIEW  COMPARISON:  11/07/2017 CT neck FINDINGS: Bilateral airspace opacities, right greater than left, with particular consolidation at the right lung apex and also in the right infrahilar region. Hazy density over the right hemithorax likely represents layering of the known right pleural effusion. Atherosclerotic calcification of the aortic arch. Mild cardiomegaly. Streaky left perihilar and basilar opacities. IMPRESSION: 1. Right greater than left airspace opacities especially in the infrahilar and right apical regions, possibilities include asymmetric edema, multilobar pneumonia, and aspiration pneumonia. 2. Lesser streaky opacities in the left perihilar region and left lower lobe. 3. Mild enlargement of the cardiopericardial silhouette. 4. Known layering right pleural effusion. 5.  Aortic Atherosclerosis (ICD10-I70.0). Electronically Signed   By: Gaylyn Rong M.D.   On: 11/18/2017 13:36   Ct Head Code Stroke Wo Contrast  Result Date: 11/12/2017 CLINICAL DATA:  RIGHT-sided weakness. Symptom duration unspecified. Concern for acute infarction. EXAM: CT HEAD WITHOUT CONTRAST CT CERVICAL SPINE WITHOUT CONTRAST TECHNIQUE: Multidetector CT imaging of the head and cervical spine was performed following the standard protocol without intravenous contrast. Multiplanar CT image reconstructions of the cervical spine were also generated. COMPARISON:  MRI cervical spine 06/30/2017. FINDINGS: CT HEAD FINDINGS Brain: Multifocal areas of cortical hypoattenuation on the LEFT, as well as loss of the insular ribbon, concerning for acute infarction. No hemorrhage, mass lesion, or extra-axial fluid. Generalized atrophy with hydrocephalus ex vacuo. Vascular: Large vessel occlusion, with dense LEFT ICA and LEFT M1/M2 MCA branches. Skull: Normal. Negative for fracture or focal lesion. Sinuses/Orbits: No acute finding. Other: None. ASPECTS score = 6. Cytotoxic edema of the insula, M2, possible M4 and M6 zones. [place] Stroke Program  Early CT Score Normal score = 10 CT CERVICAL SPINE FINDINGS Alignment: Anatomic Skull base and vertebrae: No acute fracture. No primary bone lesion or focal pathologic process. Soft tissues and spinal canal: Severe stenosis at C3-4, combination of OPLL and central disc herniation with posterior element hypertrophy. No visible canal hemorrhage. Disc levels: Multilevel spondylosis. This is better evaluated with prior MRI cervical. Upper chest: Dense opacity in the RIGHT upper lobe, favoring consolidation, query aspiration. No similar findings on the LEFT to suggest asymmetric edema. RIGHT pleural effusion. Other: None. IMPRESSION: CT head revealing emergent large vessel occlusion affecting the LEFT ICA and LEFT M1/M2 MCA. Code stroke ASPECTS score of 6.  CT perfusion to follow. Cervical spondylosis, with multilevel degenerative change and stenosis, which appears similar to prior MR cervical of 06/30/2017. Suspected RIGHT lung aspiration pneumonia.  Large RIGHT effusion. These results were called by telephone at the time of interpretation on 11/13/2017 at 12:20 pm to Dr. Wilford Corner, who verbally acknowledged these results. Electronically Signed   By: Elsie Stain M.D.   On: 11/16/2017 12:34   EKG  EKG Interpretation None     ED ECG REPORT   Date: 10/30/2017  Rate: 60  Rhythm: normal sinus rhythm and premature ventricular contractions (PVC)  QRS Axis: normal  Intervals: normal  ST/T Wave abnormalities: nonspecific T wave changes  Conduction Disutrbances:none  Narrative Interpretation:   Old EKG Reviewed: Rate slower than previous tracing  I have personally reviewed the EKG tracing and agree with the computerized printout as noted.  Radiology No results found. Chest x-ray viewed by me Procedures Procedures (including critical care time)  Medications Ordered in ED Medications  iopamidol (ISOVUE-370) 76 % injection (90 mLs  Contrast Given December 07, 2017 1215)     Initial Impression / Assessment and  Plan / ED Course  I have reviewed the triage vital signs and the nursing notes.  Pertinent labs & imaging results that were available during my care of the patient were reviewed by me and considered in my medical decision making (see chart for details).    Dr Riley Lam, neurologist evaluated patient in the ED and determined that he was not a candidate for acute neurologic intervention other than aspirin.  He requested admission to hospitalist service.  I had a lengthy conversation  with multiple patient's family members.  Suggesting that his prognosis is poor.  Chest x-ray may be consistent with aspiration.  IV antibiotics ordered..  Renal insufficiency is unchanged from 7 days ago.  Aspirin suppository ordered.  Palliative care consult ordered by me.  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 Critical care was necessary to treat or prevent imminent or life-threatening deterioration. Critical care was time spent personally by me on the following activities: development of treatment plan with patient and/or surrogate as well as nursing, discussions with consultants, evaluation of patient's response to treatment, examination of patient, obtaining history from patient or surrogate, ordering and performing treatments and interventions, ordering and review of laboratory studies, ordering and review of radiographic studies, pulse oximetry and re-evaluation of patient's condition. ED Discharge Orders    None       Doug Sou, MD 12/07/17 [phone]

## 2017-11-14 NOTE — ED Notes (Signed)
petroleum jelly applied to pt's lips

## 2017-11-14 NOTE — ED Notes (Signed)
Dr. Shela CommonsJ updating family at this time.  Radiology at bedside

## 2017-11-14 NOTE — Discharge Instructions (Signed)
The radiology team will call you tomorrow with a time to appear to have drain replaced

## 2017-11-14 NOTE — Code Documentation (Signed)
Patient lives alone and is able to independently complete activities of daily living.  Family found him down on the floor today unresponsive and unable to move his right side.  He was last known well yesterday evening before bed.  Code Stroke was called en route.  Stat labs and head CT done upon arrival.  Patient is outside the TPA window.  CT perfusion done.    Per Dr Wilford CornerArora patient is not an IR candidate.  He was at bedside to assess patient and spoke with family.  NIHSS 26.  Admit [id number].

## 2017-11-14 NOTE — ED Notes (Signed)
Dr.Jacubowitz shown results of istat troponin. ED-Lab

## 2017-11-14 NOTE — ED Notes (Signed)
MD at bedside.  Removed c-collar.  Asked for patient not to be on [place] O2, everything removed.

## 2017-11-15 ENCOUNTER — Inpatient Hospital Stay (HOSPITAL_COMMUNITY): Payer: Medicare HMO

## 2017-11-15 DIAGNOSIS — I130 Hypertensive heart and chronic kidney disease with heart failure and stage 1 through stage 4 chronic kidney disease, or unspecified chronic kidney disease: Secondary | ICD-10-CM

## 2017-11-15 DIAGNOSIS — Z515 Encounter for palliative care: Secondary | ICD-10-CM

## 2017-11-15 DIAGNOSIS — Z66 Do not resuscitate: Secondary | ICD-10-CM

## 2017-11-15 DIAGNOSIS — E119 Type 2 diabetes mellitus without complications: Secondary | ICD-10-CM

## 2017-11-15 DIAGNOSIS — N401 Enlarged prostate with lower urinary tract symptoms: Secondary | ICD-10-CM

## 2017-11-15 DIAGNOSIS — I6523 Occlusion and stenosis of bilateral carotid arteries: Secondary | ICD-10-CM

## 2017-11-15 DIAGNOSIS — I63512 Cerebral infarction due to unspecified occlusion or stenosis of left middle cerebral artery: Principal | ICD-10-CM

## 2017-11-15 DIAGNOSIS — I5042 Chronic combined systolic (congestive) and diastolic (congestive) heart failure: Secondary | ICD-10-CM

## 2017-11-15 DIAGNOSIS — I509 Heart failure, unspecified: Secondary | ICD-10-CM

## 2017-11-15 LAB — ECHOCARDIOGRAM LIMITED
Height: 70 in
Weight: 3276.92 oz

## 2017-11-15 LAB — LIPID PANEL
CHOL/HDL RATIO: 4.4 ratio
Cholesterol: 127 mg/dL (ref 0–200)
HDL: 29 mg/dL — AB (ref >40)
LDL CALC: 77 mg/dL (ref 0–99)
TRIGLYCERIDES: 104 mg/dL (ref <150)
VLDL: 21 mg/dL (ref 0–40)

## 2017-11-15 LAB — BASIC METABOLIC PANEL
Anion gap: 13 (ref 5–15)
BUN: 27 mg/dL — AB (ref 6–20)
CALCIUM: 8.5 mg/dL — AB (ref 8.9–10.3)
CO2: 22 mmol/L (ref 22–32)
CREATININE: 2.5 mg/dL — AB (ref 0.61–1.24)
Chloride: 109 mmol/L (ref 101–111)
GFR calc Af Amer: 25 mL/min — ABNORMAL LOW (ref >60)
GFR, EST NON AFRICAN AMERICAN: 22 mL/min — AB (ref >60)
GLUCOSE: 186 mg/dL — AB (ref 65–99)
Potassium: 3.3 mmol/L — ABNORMAL LOW (ref 3.5–5.1)
Sodium: 144 mmol/L (ref 135–145)

## 2017-11-15 LAB — HEMOGLOBIN A1C
HEMOGLOBIN A1C: 6.2 % — AB (ref 4.8–5.6)
Mean Plasma Glucose: 131.24 mg/dL

## 2017-11-15 MED ORDER — MORPHINE SULFATE (PF) 4 MG/ML IV SOLN
2.0000 mg | INTRAVENOUS | Status: DC | PRN
  Administered 2017-11-15 – 2017-11-18 (×8): 2 mg via INTRAVENOUS
  Filled 2017-11-15 (×8): qty 1

## 2017-11-15 MED ORDER — PIPERACILLIN-TAZOBACTAM 3.375 G IVPB
3.3750 g | Freq: Three times a day (TID) | INTRAVENOUS | Status: DC
  Administered 2017-11-15 – 2017-11-19 (×13): 3.375 g via INTRAVENOUS
  Filled 2017-11-15 (×15): qty 50

## 2017-11-15 MED ORDER — ROSUVASTATIN CALCIUM 20 MG PO TABS: 20.0000 mg | ORAL_TABLET | Freq: Every day | ORAL | Status: DC

## 2017-11-15 MED ORDER — PERFLUTREN LIPID MICROSPHERE
1.0000 mL | INTRAVENOUS | Status: AC | PRN
  Administered 2017-11-15: 2 mL via INTRAVENOUS

## 2017-11-15 MED ORDER — POTASSIUM CHLORIDE IN NACL 40-0.9 MEQ/L-% IV SOLN
INTRAVENOUS | Status: DC
  Administered 2017-11-15 – 2017-11-17 (×4): 75 mL/h via INTRAVENOUS
  Filled 2017-11-15 (×4): qty 1000

## 2017-11-15 MED ORDER — VANCOMYCIN HCL IN DEXTROSE 750-5 MG/150ML-% IV SOLN
750.0000 mg | INTRAVENOUS | Status: DC
  Administered 2017-11-15 – 2017-11-17 (×2): 750 mg via INTRAVENOUS
  Filled 2017-11-15 (×3): qty 150

## 2017-11-15 MED ORDER — ORAL CARE MOUTH RINSE
15.0000 mL | Freq: Two times a day (BID) | OROMUCOSAL | Status: DC
  Administered 2017-11-15 – 2017-11-21 (×11): 15 mL via OROMUCOSAL

## 2017-11-15 MED ORDER — CHLORHEXIDINE GLUCONATE 0.12 % MT SOLN
15.0000 mL | Freq: Two times a day (BID) | OROMUCOSAL | Status: DC
  Administered 2017-11-15 – 2017-11-18 (×9): 15 mL via OROMUCOSAL
  Filled 2017-11-15 (×5): qty 15

## 2017-11-15 NOTE — Progress Notes (Signed)
  Echocardiogram 2D Echocardiogram has been performed.  Clair Alfieri L Androw 11/15/2017, 10:44 AM

## 2017-11-15 NOTE — Progress Notes (Signed)
Patient was given a complete bed bath followed by a ;CHG wipe down.  Patient did appear to be more alert.  Did attempt to speak, however his words were garbled and incomprehensible.  His LUE was stronger than earlier assessed; his hand grasp was weak.  RUE remains flaccid.  BLE are withdrawing to tactile stimuli.  Patient appeared agitated with oral care (Peridex mouth swab).  Will continue to monitor patient.

## 2017-11-15 NOTE — Progress Notes (Signed)
PT Cancellation Note  Patient Details Name: Aaron Rivera H Hoppes MRN: [id number] DOB: 03/01/1930   Cancelled Treatment:    Reason Eval/Treat Not Completed: Medical issues which prohibited therapy(Pt transitioning to comfort care per nurse.  Will sign off. )   Berline LopesDawn F Shawntina Diffee 11/15/2017, 2:39 PM [facility] CenterDawn Zlatan Hornback,PT Acute Rehabilitation [phone] (pager)

## 2017-11-15 NOTE — Progress Notes (Signed)
OT Cancellation Note  Patient Details Name: Aaron Rivera MRN: [id number] DOB: 01/09/1930   Cancelled Treatment:    Reason Eval/Treat Not Completed: Patient at procedure or test/ unavailable.  Pt and family meeting with palliative.  Will reattempt.  Yaremi Stahlman Allendaleonarpe, OTR/L [phone]   Jeani HawkingConarpe, Clebert Wenger M 11/15/2017, 12:45 PM

## 2017-11-15 NOTE — Care Management Note (Signed)
Case Management Note  Patient Details  Name: Currie H Vandrunen MRSallye Ober: [id number] Date of Birth: 1930-03-25  Subjective/Objective:   From home, presents with acute ischemic left MCA stroke, AAA, DM2, chf.                  Action/Plan: NCM will follow for dc needs.   Expected Discharge Date:                  Expected Discharge Plan:     In-House Referral:     Discharge planning Services  CM Consult  Post Acute Care Choice:    Choice offered to:     DME Arranged:    DME Agency:     HH Arranged:    HH Agency:     Status of Service:  In process, will continue to follow  If discussed at Long Length of Stay Meetings, dates discussed:    Additional Comments:  Leone Havenaylor, Kevontay Burks [place], RN 11/15/2017, 5:23 PM

## 2017-11-15 NOTE — Evaluation (Signed)
Clinical/Bedside Swallow Evaluation Patient Details  Name: Aaron Rivera MRN: [id number] Date of Birth: 20-Jun-1930  Today's Date: 11/15/2017 Time: SLP Start Time (ACUTE ONLY): 1320 SLP Stop Time (ACUTE ONLY): 1340 SLP Time Calculation (min) (ACUTE ONLY): 20 min  Past Medical History:  Past Medical History:  Diagnosis Date  . CHF (congestive heart failure) ([place])   . Diabetes mellitus without complication ([place])   . GERD (gastroesophageal reflux disease)   . Glaucoma    left eye   . Hypertension    Past Surgical History:  Past Surgical History:  Procedure Laterality Date  . ABDOMINAL AORTIC ANEURYSM REPAIR    . cataract surgert Bilateral 2018   HPI:  83 y.o.malewith medical history significant forhypertension,systolic and dCHF,hyperlipidemia,AAA, andNIDDMwho presents on 12/25/2018after being found down by family for unknown period of time.  Dx acute ischemic large left MCA and right punctate cerebellarstrokes.  Palliative care has been consulted.    Assessment / Plan / Recommendation Clinical Impression  Pt presents with a neurogenic dysphagia with poor alertness today, inability to follow commands, right gaze preference.  Intermittently lethargic.  With max verbal/tactile cueing, accepted limited ice chips, tspns water with decreased awareness, poor labial retention, likely delayed swallow response, and consistent wet cough after all trials, suggesting likely aspiration.  Pt with poor management of secretions at baseline, requiring frequent oral suctioning.  For today, recommend continuing NPO status.  Discussed results/recs with pt's granddaughter and answered her questions about pt's swallowing.  SLP will f/u next date for PO readiness.  Pt's family has met with Palliative Care - will follow along.  SLP Visit Diagnosis: Dysphagia, unspecified (R13.10)    Aspiration Risk       Diet Recommendation   NPO       Other  Recommendations Oral Care Recommendations: Oral care  QID   Follow up Recommendations Other (comment)(tba)      Frequency and Duration min 3x week  2 weeks       Prognosis Prognosis for Safe Diet Advancement: Guarded      Swallow Study   General Date of Onset: 11/17/2017 HPI: 83 y.o.malewith HPI: 83 y.o.malewith medical history significant forhypertension,systolic and dCHF,hyperlipidemia,AAA, andNIDDMwho presents on 12/25/2018after being found down by family for unknown period of time.  Dx acute ischemic large left MCA and right punctate cerebellarstrokes.  Palliative care has been consulted.  Type of Study: Bedside Swallow Evaluation Previous Swallow Assessment: no Diet Prior to this Study: NPO Temperature Spikes Noted: No Respiratory Status: Nasal cannula History of Recent Intubation: No Behavior/Cognition: Lethargic/Drowsy Oral Cavity Assessment: Other (comment)(unable to ascertain) Oral Care Completed by SLP: No Oral Cavity - Dentition: Edentulous Vision: Impaired for self-feeding Self-Feeding Abilities: Total assist Patient Positioning: Upright in bed Baseline Vocal Quality: Not observed Volitional Cough: Cognitively unable to elicit Volitional Swallow: Unable to elicit    Oral/Motor/Sensory Function Overall Oral Motor/Sensory Function: Mild impairment Facial ROM: Suspected CN VII (facial) dysfunction Facial Symmetry: Suspected CN VII (facial) dysfunction;Abnormal symmetry right   Ice Chips Ice chips: Impaired Presentation: Spoon Oral Phase Impairments: Reduced labial seal;Reduced lingual movement/coordination;Poor awareness of bolus Oral Phase Functional Implications: Oral holding Pharyngeal Phase Impairments: Cough - Immediate   Thin Liquid Thin Liquid: Impaired Presentation: Spoon Oral Phase Impairments: Reduced labial seal Oral Phase Functional Implications: Oral holding Pharyngeal  Phase Impairments: Cough - Immediate    Nectar Thick Nectar Thick Liquid: Not tested   Honey Thick Honey Thick Liquid: Not tested   Puree Puree:  Not tested   Solid   GO  Solid: Not tested        Juan Quam Laurice 11/15/2017,2:33 PM  Estill Bamberg L. Tivis Ringer, [place] CCC/SLP Pager [phone]

## 2017-11-15 NOTE — Consult Note (Signed)
Consultation Note Date: 11/15/2017   Patient Name: Aaron Rivera H Raymond  DOB: [birth year]  MRN: [id number]  Age / Sex: 83 y.o., male  PCP: Maple HudsonGilbert, Richard L Jr., MD Referring Physician: Calvert Cantorizwan, Saima, MD  Reason for Consultation: Establishing goals of care and Psychosocial/spiritual support  HPI/Patient Profile: 83 y.o. male   admitted on 11/04/2017 with a past medical history significant for hypertension, CAD,hyperlipidemia,AAA, and diabetes  who was admitted after being found down by family for unknown period of time. A  code stroke is initiated    Patient's baseline was fully independent, living alone and driving.  Of note patient was recently hospitalized for new onset CHF and associated demand ischemia.    CT head: Emergent large vessel occlusion of left ICA left MCA territory.    CTneck: 90% high-grade stenosis of right ICA, 75-90% stenosis of proximal left ICA.  CT perfusion: Large core MCA territory infarct, moderate penumbra surrounding cord area of ischemia.  Today Dr Pearlean BrownieSethi shared the poor prognosis, family face treatment option decisions, advanced directive decisions and anticipatory care needs.  Clinical Assessment and Goals of Care:   This NP Lorinda CreedMary Bellah Alia reviewed medical records, received report from team, assessed the patient and then meet at the patient's bedside along with large family to include five daughter, several sisters and in laws and cousins  to discuss diagnosis, prognosis, GOC, EOL wishes disposition and options.  A detailed discussion was had today regarding advanced directives.  Concepts specific to code status, artifical feeding and hydration, continued IV antibiotics and rehospitalization was had.  The difference between a aggressive medical intervention path  and a palliative comfort care path for this patient at this time was had.  Values and goals of care important to  patient and family were attempted to be elicited.  MOST form introduced  Concept of Hospice and Palliative Care were discussed  Natural trajectory and expectations at EOL were discussed.    Questions and concerns addressed.   Family encouraged to call with questions or concerns.    PMT will continue to support holistically.  Re-meet tomorrow at 1100 am  No documented HPOA, seven living children.  Family is working well together in the best interest of this man.    SUMMARY OF RECOMMENDATIONS    Code Status/Advance Care Planning:  DNR/DNI-documented today   Symptom Management:   Pain/Dyspnea: Morphine 2 mg IV every 3 hrs prn  Palliative Prophylaxis:   Bowel, aspiration, frequent pain assessment  Additional Recommendations (Limitations, Scope, Preferences):  At this time family continues to process today conversation and medical information shared with them.  They are clearly leaning toward a more comfort approach  Daughter Carilyn GoodpastureDinah Marshall/spokesperson agrees to meet with me in the morning at 1100 to clarify plan of care.  Psycho-social/Spiritual:   Desire for further Chaplaincy support: No declined at this tiem, they have strong community and family support Additional Recommendations: education of hospice benefit and facility  Prognosis:   Days to weeks  Discharge Planning: to be determined  Primary Diagnoses: Present on Admission: . AI (aortic incompetence) . AAA (abdominal aortic aneurysm) (HCC) . Essential (primary) hypertension . HLD (hyperlipidemia) . Carotid stenosis . Benign prostatic hyperplasia with lower urinary tract symptoms   I have reviewed the medical record, interviewed the patient and family, and examined the patient. The following aspects are pertinent.  Past Medical History:  Diagnosis Date  . CHF (congestive heart failure) (H[facility])   . Diabetes mellitus without complication ([facility])   . GERD (gastroesophageal reflux disease)   . Glaucoma     left eye   . Hypertension    Social History   Socioeconomic History  . Marital status: Widowed    Spouse name: None  . Number of children: None  . Years of education: None  . Highest education level: None  Social Needs  . Financial resource strain: None  . Food insecurity - worry: None  . Food insecurity - inability: None  . Transportation needs - medical: None  . Transportation needs - non-medical: None  Occupational History  . None  Tobacco Use  . Smoking status: Former Smoker    Packs/day: 1.00    Years: 40.00    Pack years: 40.00    Types: Cigarettes    Last attempt to quit: 11/07/1977    Years since quitting: 40.0  . Smokeless tobacco: Former NeurosurgeonUser    Quit date: 11/20/1969  Substance and Sexual Activity  . Alcohol use: No    Alcohol/week: 0.0 oz  . Drug use: No  . Sexual activity: No  Other Topics Concern  . None  Social History Narrative  . None   Family History  Problem Relation Age of Onset  . Heart disease Mother   . Breast cancer Sister   . Prostate cancer Brother   . Hypertension Brother   . Hypertension Brother    Scheduled Meds: . aspirin  300 mg Rectal Daily   Or  . aspirin  325 mg Oral Daily  . chlorhexidine  15 mL Mouth Rinse BID  . Chlorhexidine Gluconate Cloth  6 each Topical Q0600  . heparin  5,000 Units Subcutaneous Q8H  . mouth rinse  15 mL Mouth Rinse q12n4p  . mupirocin ointment  1 application Nasal BID   Continuous Infusions: . 0.9 % NaCl with KCl 40 mEq / L    . piperacillin-tazobactam (ZOSYN)  IV 3.375 g (11/15/17 1108)  . vancomycin Stopped (11/15/17 16100614)   PRN Meds:.acetaminophen **OR** acetaminophen (TYLENOL) oral liquid 160 mg/5 mL **OR** acetaminophen, perflutren lipid microspheres (DEFINITY) IV suspension, white petrolatum Medications Prior to Admission:  Prior to Admission medications   Medication Sig Start Date End Date Taking? Authorizing Provider  captopril (CAPOTEN) 25 MG tablet TAKE 1/2 TABLET BY MOUTH TWICE A  DAY Patient taking differently: Take 12.5 mg by mouth two times a day 01/10/17  Yes Maple HudsonGilbert, Richard L Jr., MD  carvedilol (COREG) 12.5 MG tablet Take 1 tablet (12.5 mg total) by mouth 2 (two) times daily with a meal. 11/04/17  Yes Enedina FinnerPatel, Sona, MD  furosemide (LASIX) 40 MG tablet Take 1 tablet (40 mg total) by mouth daily. 11/04/17  Yes Enedina FinnerPatel, Sona, MD  KLOR-CON 10 10 MEQ tablet TAKE 2 TABLETS EVERY DAY Patient taking differently: Take 20 mEq (2 tablets) by mouth once a day 10/11/17  Yes Maple HudsonGilbert, Richard L Jr., MD  latanoprost (XALATAN) 0.005 % ophthalmic solution Place 1 drop into both eyes at bedtime.  09/16/17  Yes [provider]  Lifitegrast Benay Spice(XIIDRA) 5 % SOLN  Place 1 drop into both eyes daily.  10/16/17  Yes [provider]  omeprazole (PRILOSEC) 20 MG capsule Take 1 capsule (20 mg total) daily as needed by mouth. Patient taking differently: Take 20 mg by mouth daily.  09/27/17  Yes Maple Hudson., MD  pioglitazone (ACTOS) 15 MG tablet TAKE 1 TABLET (15 MG TOTAL) BY MOUTH EVERY MORNING. 09/14/17  Yes Maple Hudson., MD  ranitidine (ZANTAC) 150 MG tablet Take 150 mg by mouth at bedtime. 10/11/17  Yes [provider]  rosuvastatin (CRESTOR) 20 MG tablet Take 1 tablet (20 mg total) by mouth daily. 12/12/16  Yes Maple Hudson., MD  terazosin (HYTRIN) 2 MG capsule Take 2 mg by mouth once a day 10/16/17  Yes [provider]  [facility] VERIO test strip USE 2 TIMES A DAY AS INSTRUCTED 02/27/17   Maple Hudson., MD   No Known Allergies Review of Systems  Unable to perform ROS: Acuity of condition    Physical Exam  Constitutional: He appears well-developed. He appears listless.  Cardiovascular: Normal rate, regular rhythm and normal heart sounds.  Pulmonary/Chest: Effort normal.  Abdominal: Soft. Bowel sounds are normal.  Neurological: He appears listless. A cranial nerve deficit and sensory deficit is present. He exhibits abnormal  muscle tone.  Skin: Skin is warm and dry.    Vital Signs: BP (!) 152/64   Pulse 68   Temp 98.2 F (36.8 C) (Axillary)   Resp (!) 24   Ht 5\' 10"  (1.778 m)   Wt 92.9 kg (204 lb 12.9 oz)   SpO2 100%   BMI 29.39 kg/m  Pain Assessment: Faces   Pain Score: 0-No pain   SpO2: SpO2: 100 % O2 Device:SpO2: 100 % O2 Flow Rate: .O2 Flow Rate (L/min): 2 L/min  IO: Intake/output summary:   Intake/Output Summary (Last 24 hours) at 11/15/2017 1155 Last data filed at 11/15/2017 0865 Gross per 24 hour  Intake 1300 ml  Output 850 ml  Net 450 ml    LBM: Last BM Date: (unknown) Baseline Weight: Weight: 95.5 kg (210 lb 8.6 oz) Most recent weight: Weight: 92.9 kg (204 lb 12.9 oz)     Palliative Assessment/Data:   20%   Discussed with Dr Pearlean Brownie and bedside RN  Time In: 1330 Time Out: 1415 Time Total: 75 min Greater than 50%  of this time was spent counseling and coordinating care related to the above assessment and plan.  Signed by: Lorinda Creed, NP   Please contact Palliative Medicine Team phone at [phone] for questions and concerns.  For individual provider: See Loretha Stapler

## 2017-11-15 NOTE — Progress Notes (Signed)
At start of shift family was present at bedside.  Patient's neurological status remains unchanged will continue to monitor q2hrs as per protocol.  IVF's continue to infuse.  HOB elevated to prevent aspiration.  MRSA positive and contact isolation protocol implemented.  Will continue to monitor comfort, neurological changes and safety.

## 2017-11-15 NOTE — Progress Notes (Signed)
Pharmacy Antibiotic Note  Aaron Rivera is a 83 y.o. male admitted on 11/13/2017 with CVA and PNA.  Pharmacy has been consulted for Vancomycin and Zosyn  Dosing.  Vancomycin 1 g IV given in ED at  1600  Plan: Vancomycin 750 mg IV q48h Zosyn 3.375 g IV q8h   Height: 5\' 10"  (177.8 cm) Weight: 204 lb 12.9 oz (92.9 kg) IBW/kg (Calculated) : 73  Temp (24hrs), Avg:98.2 F (36.8 C), Min:97.5 F (36.4 C), Max:99 F (37.2 C)  Recent Labs  Lab 11/09/2017 1151 11/11/2017 1202  WBC 6.4  --   CREATININE 2.66* 2.60*    Estimated Creatinine Clearance: 23.8 mL/min (A) (by C-G formula based on SCr of 2.6 mg/dL (H)).    No Known Allergies   Aaron Rivera, Aaron Rivera 11/15/2017 12:16 AM

## 2017-11-15 NOTE — Progress Notes (Signed)
NEUROHOSPITALISTS STROKE TEAM - DAILY PROGRESS NOTE   ADMISSION HISTORY: Aaron Rivera is a 83 y.o. male past medical history of CHF, recent discharge for an STEMI and acute on chronic congestive heart failure, chronic kidney disease 3, diabetes, hypertension, presents to the emergency room via EMS as an acute code stroke.  Last seen normal last night before he went to bed witnessed by family, did not wake up this morning and when family went to check they found him on the floor unresponsive.  He was not able to talk or move the right side of his body. 911 was called and patient was brought into [facility] further evaluation. Patient CBG on site was greater than 200.  His systolic blood pressure was in the range of 130-160.  His heart rate was 65. He had leftward gaze preference on initial EMS examination along right-sided hemiplegia. At baseline he lives independently, does not cook but eats outside where he goes by himself, still driving.  LKW: 11:30 PM on 11/13/2017 tpa given?: no, outside the window Premorbid modified Rankin scale (mRS): 0 NIH stroke scale 26  SUBJECTIVE (INTERVAL HISTORY) No family is at the bedside during rounds but I spoke to daughters later. Patient is found laying in bed in NAD. No new events reported overnight. Patient is awake, does not follow any commands, globally aphasic.  Will moan when questions are asked.  OBJECTIVE Lab Results: CBC:  Recent Labs  Lab 11/18/2017 1151 10/30/2017 1202  WBC 6.4  --   HGB 10.8* 11.6*  HCT 33.8* 34.0*  MCV 93.1  --   PLT 101*  --    BMP: Recent Labs  Lab 10/24/2017 1151 11/16/2017 1202 11/15/17 0742  NA 143 147* 144  K 3.5 3.5 3.3*  CL 108 110 109  CO2 23  --  22  GLUCOSE 188* 188* 186*  BUN 32* 31* 27*  CREATININE 2.66* 2.60* 2.50*  CALCIUM 8.7*  --  8.5*   Liver Function Tests:  Recent Labs  Lab 11/20/2017 1151  AST 108*  ALT  152*  ALKPHOS 112  BILITOT 1.3*  PROT 7.4  ALBUMIN 3.0*   Coagulation Studies:  Recent Labs    11/20/2017 1151  APTT 32  INR 1.49   PHYSICAL EXAM Temp:  [97.5 F (36.4 C)-99 F (37.2 C)] 98.7 F (37.1 C) (12/26 1206) Pulse Rate:  [63-77] 72 (12/26 1206) Resp:  [13-27] 19 (12/26 1206) BP: (141-157)/(64-102) 142/80 (12/26 1206) SpO2:  [92 %-100 %] 100 % (12/26 1206) Weight:  [92.9 kg (204 lb 12.9 oz)] 92.9 kg (204 lb 12.9 oz) (12/25 1701) General: Patient is awake, alert in no distress not following commands HEENT: Normocephalic,  Lungs clear to auscultation everywhere with the exception of right upper lobe Extremities: 1+ edema Neurological exam Patient is awake,  does not follow any commands. He is globally aphasic, will occasionally moan and nod his head when questions are asked Cranial nerves: Pupils equal round reactive to light, left gaze preference-able to come to midline but cannot gaze to the right, no blink to threat from the right, right facial droop of the lower face. Motor exam: Flaccid right upper extremity, minimal movement to noxious stimulus on the right lower extremity, nearly full strength on both left upper and lower extremity. Sensory exam: Dense sensory loss on the right to noxious stimulus. Coordination cannot be assessed Gait exam was deferred  IMAGING: I have personally reviewed the radiological images below and agree  with the radiology interpretations.  Ct Angio Head and Neck W Or Wo Contrast Result Date: 11/03/2017 IMPRESSION: Emergent large vessel occlusion of the LEFT ICA and LEFT MCA territory. No significant collateral flow to the LEFT hemisphere either RIGHT-to-LEFT or from the posterior circulation. High-grade stenosis of the RIGHT internal carotid artery, 90% or greater. Possible intraluminal thrombus (versus eccentric posterior wall soft plaque) without evidence for distal emboli. Estimated 75-90% stenosis Proximal LEFT internal carotid artery,  above which there is poor opacification due to the intracranial occlusion.  Mr Brain Wo Contrast Result Date: 11/02/2017 IMPRESSION: 1. Acute left MCA territory infarct without hemorrhage. 2. Punctate acute infarct in the right cerebellum. 3. Known left ICA and MCA occlusion. Clot is also present in the left posterior communicating artery.   Ct Cerebral Perfusion W Contrast Result Date: 10/21/2017 IMPRESSION: CT brain perfusion findings indicating a large core MCA territory infarct of 118 mL. There is a moderate penumbra of 60 mL surrounding the core area of ischemia.   Ct Head Code Stroke Wo Contrast Result Date: 10/30/2017 IMPRESSION: CT head revealing emergent large vessel occlusion affecting the LEFT ICA and LEFT M1/M2 MCA. Code stroke ASPECTS score of 6.  CT perfusion to follow. Cervical spondylosis, with multilevel degenerative change and stenosis, which appears similar to prior MR cervical of 06/30/2017. Suspected RIGHT lung aspiration pneumonia.  Large RIGHT effusion.  Echocardiogram:  Study Conclusions - Left ventricle: The cavity size was normal. Wall thickness was   normal. Systolic function was moderately to severely reduced. The   estimated ejection fraction was in the range of 30% to 35%.   Diffuse hypokinesis. The study is not technically sufficient to   allow evaluation of LV diastolic function. - Aortic valve: There was trivial regurgitation. - Left atrium: The atrium was mildly dilated. Impressions: - Technically difficult; definity used; moderate to severe global   reduction in LV systolic function; trace AI and MR; mild LAE     ASSESSMENT: Mr. Aaron Rivera is a 83 y.o. male with PMH of CHF, recent discharge for an STEMI and acute on chronic congestive heart failure, chronic kidney disease 3, diabetes, hypertension who presented for evaluation of right-sided weakness, aphasia and leftward gaze. His exam is consistent with a complete left MCA syndrome with an NIH  stroke scale of 26. Outside the window for IV TPA. Has a large vessel occlusion but not a candidate for endovascular therapy because of the large volume core in the left MCA territory. At this time, does have high-grade right ICA stenosis but would not recommend any acute intervention at this time.   Acute, large core, left MCA territory infarct without hemorrhage Punctate acute infarct in the right cerebellum Known left ICA and MCA occlusion Clot is also present in the left posterior communicating artery.   STROKE:  Suspected Etiology: cardio embolic versus atheroembolic Resultant Symptoms: Aphasia, dysphasia, right-sided hemiplegia Stroke Risk Factors: diabetes mellitus, hyperlipidemia and hypertension Other Stroke Risk Factors: Advanced age, CKD, CHF, Recent STEMI  Outstanding Stroke Work-up Studies:    Workup completed  11/15/2017: Patient remains with significant aphasia, dysphasia and right hemiplegia.  Dr Pearlean Brownie had long discussion with family at bedside.  Reviewed all imaging labs and plan of care.  Family likely deciding to make patient DNR and comfort care.  Palliative care has been consulted.  PLAN  11/15/2017: Continue Aspirin/ Statin Frequent neuro checks Telemetry monitoring PT/OT/SLP Consult Case Management /MSW Ongoing aggressive stroke risk factor management Follow up with [facility]  in 6 weeks, likely not appropriate at this time  INTRACRANIAL Atherosclerosis &Stenosis: DAPT therapy not appropriate at this time  DYSPHAGIA: NPO until passes SLP swallow evaluation Aspiration Precautions in progress  CEREBRAL EDEMA: No need for 3% NS Infusion at this time, comfort care in progress  HYPERTENSION: Stable Permissive hypertension (OK if <220/120) for 24-48 hours post stroke and then gradually normalized within 5-7 days. Nicardipine drip, Labetolol PRN Long term BP goal normotensive. May slowly restart home B/P medications after 48  hours  HYPERLIPIDEMIA:    Component Value Date/Time   CHOL 127 11/15/2017 0518   CHOL 165 03/27/2017 0946   TRIG 104 11/15/2017 0518   HDL 29 (L) 11/15/2017 0518   HDL 31 (L) 03/27/2017 0946   CHOLHDL 4.4 11/15/2017 0518   VLDL 21 11/15/2017 0518   LDLCALC 77 11/15/2017 0518   LDLCALC 111 (H) 03/27/2017 0946  Home Meds:  Crestor 20 mg LDL  goal < 70 Continued on Crestor 20 mg daily Continue statin at discharge, if appopriate  DIABETES: Lab Results  Component Value Date   HGBA1C 6.2 (H) 11/15/2017  HgbA1c goal < 7.0  Other Active Problems: Principal Problem:   Acute ischemic left MCA stroke (HCC) Active Problems:   AAA (abdominal aortic aneurysm) (HCC)   AI (aortic incompetence)   Benign prostatic hyperplasia with lower urinary tract symptoms   Essential (primary) hypertension   HLD (hyperlipidemia)   Diabetes mellitus, type 2 (HCC)   Carotid stenosis   Chronic systolic CHF (congestive heart failure) (HCC)   Multifocal pneumonia  Hospital day # 1 VTE prophylaxis: SCD's  Diet : Diet NPO time specified   FAMILY UPDATES: No family at bedside  TEAM UPDATES: Calvert Cantorizwan, Saima, MD STATUS:  DNR  Palliative care DNI/DNR/comfort measures in progress     Prior Home Stroke Medications:  No antithrombotic  Discharge Stroke Meds:  Please discharge patient on aspirin 325 mg daily   Disposition: 01-Home or Self Care Therapy Recs:               PENDING Home Equipment:         PENDING Follow Up:  Follow-up Information    Micki RileySethi, Pramod S, MD. Schedule an appointment as soon as possible for a visit in 6 week(s).   Specialties:  Neurology, Radiology Why:  Gilford RaidNLY IF APPROPRIATE Contact information: [address] [place] [address] [phone]          Maple HudsonGilbert, Richard L Jr., MD -PCP Follow up in 1-2 weeks     Brita RompMary A Costello, ANP-C Stroke Neurology Team 11/15/2017 4:26 PM I have personally examined this patient, reviewed notes, independently viewed  imaging studies, participated in medical decision making and plan of care.ROS completed by me personally and pertinent positives fully documented  I have made any additions or clarifications directly to the above note. Agree with note above.  He has presented with large left MCA infarct due to left carotid occlusion and prognosis is very poor with likely impending cytotoxic cerebral edema, brain herniation and a very poor outcome even with aggressive life support.  I had a long discussion with his daughters and other family members and they understood the poor prognosis and agreed to DNR and likely comfort care soon but await arrival of other family members and discussion with them. This patient is critically ill and at significant risk of neurological worsening, death and care requires constant monitoring of vital signs, hemodynamics,respiratory and cardiac monitoring, extensive review of multiple databases, frequent  neurological assessment, discussion with family, other specialists and medical decision making of high complexity.I have made any additions or clarifications directly to the above note.This critical care time does not reflect procedure time, or teaching time or supervisory time of PA/NP/Med Resident etc but could involve care discussion time.  I spent 30 minutes of neurocritical care time  in the care of  this patient.      Delia Heady, MD Medical Director [facility] Pager: [phone] 11/15/2017 6:04 PM  Neurology to sign-off at this time. Please call with any further questions or concerns. Thank you for this consultation.  To contact Stroke Continuity provider, please refer to [email or web address]. After hours, contact General Neurology

## 2017-11-15 NOTE — Progress Notes (Signed)
OT Cancellation Note  Patient Details Name: Aaron Rivera MRN: [id number] DOB: [birth year]   Cancelled Treatment:    Reason Eval/Treat Not Completed: Per nsg, transitioning to comfort care.  Will sign off at this time   [facility] CareWendi Otho Michalik, OTR/L [phone]   Jeani HawkingConarpe, Aaron Rivera 11/15/2017, 4:06 PM

## 2017-11-15 NOTE — Progress Notes (Signed)
PROGRESS NOTE    Aaron Rivera   ZOX:[id number]  DOB: 04-05-1930  DOA: Nov 22, 2017 PCP: Maple Hudson., MD   Brief Narrative:   Aaron Rivera is a 83 y.o. male with medical history significant for hypertension, systolic and dCHF,hyperlipidemia,AAA, andNIDDM who presents on 22-Nov-2017 after being found down by family for unknown period of time.  Patient presents to the ED as a code stroke with left sided gaze preference and right sided weakness.  Patient's last known normal was the night prior to admission.    Imaging reveals a large left MCA infarct.   He was discharged from [facility] on 11/04/17 where he was admitted for treatment of CHF exacerbation.  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   Punctate acute infarct in the right cerebellum.  Known left ICA and MCA occlusion. Clot is also present in the left posterior communicating artery - Palliative care consulted for GOC - the patient is a high aspiration risk, on my exam, he will not open his mouth- we will need to decide if the family would like him to have an NG/ PEG tube - formal SLP eval is pending  Active Problems:   Multifocal pneumonia - high suspicion of aspiration - he had a recent hospital stay and being treated as HCAP with Vanc and Zosyn - MRSA PCR is + and therefore will continue Vanc for now  Mild troponin elevation - 0.10- he had mild elevated troponin in the past    AAA (abdominal aortic aneurysm) - has aortoiliac stent    Benign prostatic hyperplasia  - holding Terzaosin as NPO and high  aspiration risk - follow urine output    Essential (primary) hypertension - allow for permissive HTN    HLD (hyperlipidemia) - hold Crestor    Diabetes mellitus, type 2  - hold Actos- SSI for now - A1c is 6.2    Chronic systolic CHF (congestive heart failure) - EF is 30% with grade 2dCHF per ECHO in 12/14 - was admitted in Nov to [facility] for exacerbation - weight on discharge was 232 and currently is 204 and therefore is below dry weigth - no signs of fluid overload on exam - hold Lasix  CKD 4 - stable   DVT prophylaxis: Heparin Code Status: Full code Family Communication:  Disposition Plan: follow in SDU Consultants:   Neuro/stroke team Procedures:   2 D ECHO Antimicrobials:  Anti-infectives (From admission, onward)   Start     Dose/Rate Route Frequency Ordered Stop   11/15/17 0600  vancomycin (VANCOCIN) IVPB 750 mg/150 ml premix     750 mg 150 mL/hr over 60 Minutes Intravenous Every 48 hours 11/15/17 0137     11/15/17 0200  piperacillin-tazobactam (ZOSYN) IVPB 3.375 g     3.375 g 12.5 mL/hr over 240 Minutes Intravenous Every 8 hours 11/15/17 0045     Nov 22, 2017 1415  vancomycin (VANCOCIN) IVPB 1000 mg/200 mL premix     1,000 mg 200 mL/hr over 60 Minutes Intravenous  Once November 22, 2017 1410 11/22/17 1630  02-01-2017 1415  piperacillin-tazobactam (ZOSYN) IVPB 3.375 g     3.375 g 100 mL/hr over 30 Minutes Intravenous  Once 02-01-2017 1410 02-01-2017 1524       Objective: Vitals:   11/15/17 0400 11/15/17 0500 11/15/17 0505 11/15/17 0800  BP:   (!) 150/71 (!) 152/64  Pulse: 64 63 67 68  Resp: 13 13  (!) 24  Temp:   98.6 F (37 C) 98.2 F (36.8 C)  TempSrc:   Oral Axillary  SpO2: 99% 99% 100% 100%  Weight:      Height:        Intake/Output Summary (Last 24 hours) at 11/15/2017 1056 Last data filed at 11/15/2017 [id number] Gross per 24 hour  Intake 1300 ml  Output 850 ml  Net 450 ml   Filed Weights   02-01-2017 1201 02-01-2017 1701  Weight: 95.5 kg (210 lb 8.6 oz) 92.9  kg (204 lb 12.9 oz)    Examination: General exam: Appears comfortable  HEENT: PERRLA, no sclera icterus or thrush- will not open mouth Respiratory system: Clear to auscultation. Respiratory effort normal. Cardiovascular system: S1 & S2 heard, RRR.  No murmurs  Gastrointestinal system: Abdomen soft, non-tender, nondistended. Normal bowel sound. No organomegaly Central nervous system: Alert  - able to move left side a little but does not follow much of my commands- no movement on the right for me Extremities: No cyanosis, clubbing or edema Skin: No rashes or ulcers Psychiatry:   unable to assess mood & affect      Data Reviewed: I have personally reviewed following labs and imaging studies  CBC: Recent Labs  Lab 02-01-2017 1151 02-01-2017 1202  WBC 6.4  --   NEUTROABS 4.0  --   HGB 10.8* 11.6*  HCT 33.8* 34.0*  MCV 93.1  --   PLT 101*  --    Basic Metabolic Panel: Recent Labs  Lab 02-01-2017 1151 02-01-2017 1202 11/15/17 0742  NA 143 147* 144  K 3.5 3.5 3.3*  CL 108 110 109  CO2 23  --  22  GLUCOSE 188* 188* 186*  BUN 32* 31* 27*  CREATININE 2.66* 2.60* 2.50*  CALCIUM 8.7*  --  8.5*   GFR: Estimated Creatinine Clearance: 24.8 mL/min (A) (by C-G formula based on SCr of 2.5 mg/dL (H)). Liver Function Tests: Recent Labs  Lab 02-01-2017 1151  AST 108*  ALT 152*  ALKPHOS 112  BILITOT 1.3*  PROT 7.4  ALBUMIN 3.0*   No results for input(s): LIPASE, AMYLASE in the last 168 hours. No results for input(s): AMMONIA in the last 168 hours. Coagulation Profile: Recent Labs  Lab 02-01-2017 1151  INR 1.49   Cardiac Enzymes: No results for input(s): CKTOTAL, CKMB, CKMBINDEX, TROPONINI in the last 168 hours. BNP (last 3 results) No results for input(s): PROBNP in the last 8760 hours. HbA1C: Recent Labs    11/15/17 0518  HGBA1C 6.2*   CBG: Recent Labs  Lab 02-01-2017 1153  GLUCAP 173*   Lipid Profile: Recent Labs    11/15/17 0518  CHOL 127  HDL 29*  LDLCALC 77    TRIG 104  CHOLHDL 4.4   Thyroid Function Tests: No results for input(s): TSH, T4TOTAL, FREET4, T3FREE, THYROIDAB in the last 72 hours. Anemia Panel: No results for input(s): VITAMINB12, FOLATE, FERRITIN, TIBC, IRON, RETICCTPCT in the last 72 hours. Urine analysis: No results found for: COLORURINE, APPEARANCEUR, LABSPEC, PHURINE, GLUCOSEU, HGBUR, BILIRUBINUR, KETONESUR, PROTEINUR, UROBILINOGEN, NITRITE, LEUKOCYTESUR Sepsis Labs: @LABRCNTIP (procalcitonin:4,lacticidven:4) ) Recent Results (from the past 240  hour(s))  MRSA PCR Screening     Status: Abnormal   Collection Time: 2017/12/12  5:44 PM  Result Value Ref Range Status   MRSA by PCR POSITIVE (A) NEGATIVE Final    Comment:        The GeneXpert MRSA Assay (FDA approved for NASAL specimens only), is one component of a comprehensive MRSA colonization surveillance program. It is not intended to diagnose MRSA infection nor to guide or monitor treatment for MRSA infections. RESULT CALLED TO, READ BACK BY AND VERIFIED WITH: RN A DAVIS [id number] [phone] [facility]          Radiology Studies: Ct Angio Head W Or Wo Contrast  Result Date: Dec 12, 2017 CLINICAL DATA:  RIGHT-sided weakness.  Found down. EXAM: CT ANGIOGRAPHY HEAD AND NECK TECHNIQUE: Multidetector CT imaging of the head and neck was performed using the standard protocol during bolus administration of intravenous contrast. Multiplanar CT image reconstructions and MIPs were obtained to evaluate the vascular anatomy. Carotid stenosis measurements (when applicable) are obtained utilizing NASCET criteria, using the distal internal carotid diameter as the denominator. CONTRAST:  50mL ISOVUE-370 IOPAMIDOL (ISOVUE-370) INJECTION 76% COMPARISON:  CT perfusion reported earlier. FINDINGS: CTA NECK Aortic arch: Standard branching. Imaged portion shows no evidence of aneurysm or dissection. No significant stenosis of the major arch vessel origins. Transverse arch calcifications. Right carotid system:  High-grade stenosis, estimated 90% or greater in the proximal internal carotid artery due to a combination of calcific plaque and posterior wall soft plaque. There maybe intraluminal thrombus, see image 182 series 6. Left carotid system: Estimated 75-90% stenosis due to combination of calcific and posterior wall soft plaque. There is poor enhancement of the distal cervical ICA due to the intracranial vascular occlusion. Vertebral arteries: Codominant. BILATERAL ostial calcific plaque, 75% or greater. Nonvascular soft tissues: RIGHT upper lobe consolidation, suspected aspiration pneumonia. Large RIGHT pleural effusion. Advanced spondylosis. CTA HEAD Anterior circulation: Calcification of the cavernous internal carotid arteries consistent with cerebrovascular atherosclerotic disease. There is occlusion of the LEFT internal carotid artery at the skullbase, extending to the ICA terminus. No significant filling of the LEFT M1 MCA, LEFT M2 bifurcation segments, or LEFT M3 branches over the convexity. Calcific 50-75% stenosis of the cavernous ICA on the RIGHT. No RIGHT M1 or M2 flow-limiting stenosis. Both anterior cerebral arteries are patent, but attempts of RIGHT-to-LEFT collateral circulation appear ineffective. Posterior circulation: Both vertebral arteries as well as the basilar artery are patent. Attempts at collateral flow via the LEFT PCom appear ineffective. No significant stenosis, proximal occlusion, aneurysm, or vascular malformation. Venous sinuses: As permitted by contrast timing, patent. Anatomic variants: None of significance. Delayed phase:  Not performed. Review of the MIP images confirms the above findings IMPRESSION: Emergent large vessel occlusion of the LEFT ICA and LEFT MCA territory. No significant collateral flow to the LEFT hemisphere either RIGHT-to-LEFT or from the posterior circulation. High-grade stenosis of the RIGHT internal carotid artery, 90% or greater. Possible intraluminal thrombus  (versus eccentric posterior wall soft plaque) without evidence for distal emboli. Estimated 75-90% stenosis Proximal LEFT internal carotid artery, above which there is poor opacification due to the intracranial occlusion. These results were called by telephone at the time of interpretation on Dec 12, 2017 at 1:06 pm to Dr. Milon Dikes , who verbally acknowledged these results. Electronically Signed   By: Elsie Stain M.D.   On: 2017-12-12 13:07   Ct Angio Neck W Or Wo Contrast  Result Date: 2017/12/12 CLINICAL DATA:  RIGHT-sided weakness.  Found down. EXAM: CT  ANGIOGRAPHY HEAD AND NECK TECHNIQUE: Multidetector CT imaging of the head and neck was performed using the standard protocol during bolus administration of intravenous contrast. Multiplanar CT image reconstructions and MIPs were obtained to evaluate the vascular anatomy. Carotid stenosis measurements (when applicable) are obtained utilizing NASCET criteria, using the distal internal carotid diameter as the denominator. CONTRAST:  50mL ISOVUE-370 IOPAMIDOL (ISOVUE-370) INJECTION 76% COMPARISON:  CT perfusion reported earlier. FINDINGS: CTA NECK Aortic arch: Standard branching. Imaged portion shows no evidence of aneurysm or dissection. No significant stenosis of the major arch vessel origins. Transverse arch calcifications. Right carotid system: High-grade stenosis, estimated 90% or greater in the proximal internal carotid artery due to a combination of calcific plaque and posterior wall soft plaque. There maybe intraluminal thrombus, see image 182 series 6. Left carotid system: Estimated 75-90% stenosis due to combination of calcific and posterior wall soft plaque. There is poor enhancement of the distal cervical ICA due to the intracranial vascular occlusion. Vertebral arteries: Codominant. BILATERAL ostial calcific plaque, 75% or greater. Nonvascular soft tissues: RIGHT upper lobe consolidation, suspected aspiration pneumonia. Large RIGHT pleural  effusion. Advanced spondylosis. CTA HEAD Anterior circulation: Calcification of the cavernous internal carotid arteries consistent with cerebrovascular atherosclerotic disease. There is occlusion of the LEFT internal carotid artery at the skullbase, extending to the ICA terminus. No significant filling of the LEFT M1 MCA, LEFT M2 bifurcation segments, or LEFT M3 branches over the convexity. Calcific 50-75% stenosis of the cavernous ICA on the RIGHT. No RIGHT M1 or M2 flow-limiting stenosis. Both anterior cerebral arteries are patent, but attempts of RIGHT-to-LEFT collateral circulation appear ineffective. Posterior circulation: Both vertebral arteries as well as the basilar artery are patent. Attempts at collateral flow via the LEFT PCom appear ineffective. No significant stenosis, proximal occlusion, aneurysm, or vascular malformation. Venous sinuses: As permitted by contrast timing, patent. Anatomic variants: None of significance. Delayed phase:  Not performed. Review of the MIP images confirms the above findings IMPRESSION: Emergent large vessel occlusion of the LEFT ICA and LEFT MCA territory. No significant collateral flow to the LEFT hemisphere either RIGHT-to-LEFT or from the posterior circulation. High-grade stenosis of the RIGHT internal carotid artery, 90% or greater. Possible intraluminal thrombus (versus eccentric posterior wall soft plaque) without evidence for distal emboli. Estimated 75-90% stenosis Proximal LEFT internal carotid artery, above which there is poor opacification due to the intracranial occlusion. These results were called by telephone at the time of interpretation on 11/02/2017 at 1:06 pm to Dr. Milon Dikes , who verbally acknowledged these results. Electronically Signed   By: Elsie Stain M.D.   On: 10/28/2017 13:07   Ct Cervical Spine Wo Contrast  Result Date: 10/23/2017 CLINICAL DATA:  RIGHT-sided weakness. Symptom duration unspecified. Concern for acute infarction. EXAM: CT  HEAD WITHOUT CONTRAST CT CERVICAL SPINE WITHOUT CONTRAST TECHNIQUE: Multidetector CT imaging of the head and cervical spine was performed following the standard protocol without intravenous contrast. Multiplanar CT image reconstructions of the cervical spine were also generated. COMPARISON:  MRI cervical spine 06/30/2017. FINDINGS: CT HEAD FINDINGS Brain: Multifocal areas of cortical hypoattenuation on the LEFT, as well as loss of the insular ribbon, concerning for acute infarction. No hemorrhage, mass lesion, or extra-axial fluid. Generalized atrophy with hydrocephalus ex vacuo. Vascular: Large vessel occlusion, with dense LEFT ICA and LEFT M1/M2 MCA branches. Skull: Normal. Negative for fracture or focal lesion. Sinuses/Orbits: No acute finding. Other: None. ASPECTS score = 6. Cytotoxic edema of the insula, M2, possible M4 and M6 zones. [place] Stroke Program  Early CT Score Normal score = 10 CT CERVICAL SPINE FINDINGS Alignment: Anatomic Skull base and vertebrae: No acute fracture. No primary bone lesion or focal pathologic process. Soft tissues and spinal canal: Severe stenosis at C3-4, combination of OPLL and central disc herniation with posterior element hypertrophy. No visible canal hemorrhage. Disc levels: Multilevel spondylosis. This is better evaluated with prior MRI cervical. Upper chest: Dense opacity in the RIGHT upper lobe, favoring consolidation, query aspiration. No similar findings on the LEFT to suggest asymmetric edema. RIGHT pleural effusion. Other: None. IMPRESSION: CT head revealing emergent large vessel occlusion affecting the LEFT ICA and LEFT M1/M2 MCA. Code stroke ASPECTS score of 6.  CT perfusion to follow. Cervical spondylosis, with multilevel degenerative change and stenosis, which appears similar to prior MR cervical of 06/30/2017. Suspected RIGHT lung aspiration pneumonia.  Large RIGHT effusion. These results were called by telephone at the time of interpretation on December 13, 2016 at 12:20  pm to Dr. Wilford CornerARORA, who verbally acknowledged these results. Electronically Signed   By: Elsie StainJohn T Curnes M.D.   On: December 13, 2016 12:34   Ct Pelvis Wo Contrast  Result Date: December 13, 2016 CLINICAL DATA:  Patient found lying on floor on right side this morning unresponsive. Right hip pain. EXAM: CT PELVIS WITHOUT CONTRAST TECHNIQUE: Multidetector CT imaging of the pelvis was performed following the standard protocol without intravenous contrast. COMPARISON:  09/05/2013 FINDINGS: Urinary Tract:  Ureters and bladder are within normal. Bowel: Mild diverticulosis of the colon. Appendix is normal. Visualized small bowel is normal. Vascular/Lymphatic: Aortoiliac stent graft exclude a native abdominal aneurysm measuring 6.8 x 7 cm in AP and transverse dimension unchanged. Moderate calcified plaque over the iliac and femoral arteries. No adenopathy. Reproductive:  Within normal. Other:  No free fluid or focal inflammatory change. Musculoskeletal: Mild degenerate change of the spine and hips. No evidence of acute hip fracture or dislocation. IMPRESSION: No acute fracture. Aortoiliac stent graft excluding a native abdominal aortic aneurysm measuring 6.8 x 7 cm unchanged. Diverticulosis of the colon. Electronically Signed   By: Elberta Fortisaniel  Boyle M.D.   On: December 13, 2016 12:50   Mr Brain Wo Contrast  Result Date: December 13, 2016 CLINICAL DATA:  Follow-up code stroke yesterday. EXAM: MRI HEAD WITHOUT CONTRAST TECHNIQUE: Multiplanar, multiecho pulse sequences of the brain and surrounding structures were obtained without intravenous contrast. COMPARISON:  Head CT and CTA from earlier today. FINDINGS: Brain: Essentially the entire cortex of the left MCA distribution is infarcted. There is good correlation with previous CT perfusion. Punctate acute infarct in the right cerebellum. No acute hemorrhage. There is atrophy with ventriculomegaly. Chronic small vessel ischemia with confluent gliosis in the periventricular white matter. Vascular: Loss of  left ICA and MCA flow void. The left posterior communicating artery also appears to contain clot. The left posterior communicating artery is large, with hypoplastic left P1 segment. Skull and upper cervical spine: Negative for marrow lesion. Sinuses/Orbits: No acute finding.  Bilateral cataract resection. IMPRESSION: 1. Acute left MCA territory infarct without hemorrhage. 2. Punctate acute infarct in the right cerebellum. 3. Known left ICA and MCA occlusion. Clot is also present in the left posterior communicating artery. Electronically Signed   By: Marnee SpringJonathon  Watts M.D.   On: December 13, 2016 16:55   Ct Cerebral Perfusion W Contrast  Result Date: December 13, 2016 CLINICAL DATA:  RIGHT-sided weakness, found down.Last seen normal 12:30 a.m. earlier today. EXAM: CT PERFUSION BRAIN TECHNIQUE: Multiphase CT imaging of the brain was performed following IV bolus contrast injection. Subsequent parametric perfusion maps were calculated using RAPID software.  CONTRAST:  40mL ISOVUE-370 IOPAMIDOL (ISOVUE-370) INJECTION 76% COMPARISON:  Code stroke CT earlier today. CTA head neck reported separately. FINDINGS: CT Brain Perfusion Findings: CBF (<30%) Volume: Perfusion (Tmax>6.0s) volume: Mismatch Volume: 60mL Infarction Location:A large portion of the LEFT MCA territory, including frontal, temporal, insula, and anterior parietal lobes, LEFT basal ganglia, and adjacent white matter. IMPRESSION: CT brain perfusion findings indicating a large core MCA territory infarct of 118 mL. There is a moderate penumbra of 60 mL surrounding the core area of ischemia. See discussion above. These results were called by telephone at the time of interpretation on 11/03/2017 at 12:32 pm to Dr. Milon Dikes , who verbally acknowledged these results. Electronically Signed   By: Elsie Stain M.D.   On: 10/27/2017 12:39   Dg Chest Port 1 View  Result Date: 10/31/2017 CLINICAL DATA:  Airspace opacity at the right lung apex for further  characterization. EXAM: PORTABLE CHEST 1 VIEW COMPARISON:  11/10/2017 CT neck FINDINGS: Bilateral airspace opacities, right greater than left, with particular consolidation at the right lung apex and also in the right infrahilar region. Hazy density over the right hemithorax likely represents layering of the known right pleural effusion. Atherosclerotic calcification of the aortic arch. Mild cardiomegaly. Streaky left perihilar and basilar opacities. IMPRESSION: 1. Right greater than left airspace opacities especially in the infrahilar and right apical regions, possibilities include asymmetric edema, multilobar pneumonia, and aspiration pneumonia. 2. Lesser streaky opacities in the left perihilar region and left lower lobe. 3. Mild enlargement of the cardiopericardial silhouette. 4. Known layering right pleural effusion. 5.  Aortic Atherosclerosis (ICD10-I70.0). Electronically Signed   By: Gaylyn Rong M.D.   On: 10/28/2017 13:36   Ct Head Code Stroke Wo Contrast  Result Date: 11/11/2017 CLINICAL DATA:  RIGHT-sided weakness. Symptom duration unspecified. Concern for acute infarction. EXAM: CT HEAD WITHOUT CONTRAST CT CERVICAL SPINE WITHOUT CONTRAST TECHNIQUE: Multidetector CT imaging of the head and cervical spine was performed following the standard protocol without intravenous contrast. Multiplanar CT image reconstructions of the cervical spine were also generated. COMPARISON:  MRI cervical spine 06/30/2017. FINDINGS: CT HEAD FINDINGS Brain: Multifocal areas of cortical hypoattenuation on the LEFT, as well as loss of the insular ribbon, concerning for acute infarction. No hemorrhage, mass lesion, or extra-axial fluid. Generalized atrophy with hydrocephalus ex vacuo. Vascular: Large vessel occlusion, with dense LEFT ICA and LEFT M1/M2 MCA branches. Skull: Normal. Negative for fracture or focal lesion. Sinuses/Orbits: No acute finding. Other: None. ASPECTS score = 6. Cytotoxic edema of the insula, M2,  possible M4 and M6 zones. [place] Stroke Program Early CT Score Normal score = 10 CT CERVICAL SPINE FINDINGS Alignment: Anatomic Skull base and vertebrae: No acute fracture. No primary bone lesion or focal pathologic process. Soft tissues and spinal canal: Severe stenosis at C3-4, combination of OPLL and central disc herniation with posterior element hypertrophy. No visible canal hemorrhage. Disc levels: Multilevel spondylosis. This is better evaluated with prior MRI cervical. Upper chest: Dense opacity in the RIGHT upper lobe, favoring consolidation, query aspiration. No similar findings on the LEFT to suggest asymmetric edema. RIGHT pleural effusion. Other: None. IMPRESSION: CT head revealing emergent large vessel occlusion affecting the LEFT ICA and LEFT M1/M2 MCA. Code stroke ASPECTS score of 6.  CT perfusion to follow. Cervical spondylosis, with multilevel degenerative change and stenosis, which appears similar to prior MR cervical of 06/30/2017. Suspected RIGHT lung aspiration pneumonia.  Large RIGHT effusion. These results were called by telephone at the time of interpretation on  December 07, 2017 at 12:20 pm to Dr. Wilford Corner, who verbally acknowledged these results. Electronically Signed   By: Elsie Stain M.D.   On: 2017/12/07 12:34      Scheduled Meds: . aspirin  300 mg Rectal Daily   Or  . aspirin  325 mg Oral Daily  . chlorhexidine  15 mL Mouth Rinse BID  . Chlorhexidine Gluconate Cloth  6 each Topical Q0600  . heparin  5,000 Units Subcutaneous Q8H  . mouth rinse  15 mL Mouth Rinse q12n4p  . mupirocin ointment  1 application Nasal BID   Continuous Infusions: . piperacillin-tazobactam (ZOSYN)  IV Stopped (11/15/17 0511)  . vancomycin Stopped (11/15/17 0614)     LOS: 1 day    Time spent in minutes: 35    Calvert Cantor, MD [facility] Pager: [email or web address] Password [facility] 11/15/2017, 10:56 AM

## 2017-11-16 ENCOUNTER — Inpatient Hospital Stay: Payer: Medicare HMO | Admitting: Oncology

## 2017-11-16 ENCOUNTER — Inpatient Hospital Stay: Payer: Medicare HMO | Admitting: Family Medicine

## 2017-11-16 ENCOUNTER — Inpatient Hospital Stay (HOSPITAL_COMMUNITY): Payer: Medicare HMO

## 2017-11-16 DIAGNOSIS — R1319 Other dysphagia: Secondary | ICD-10-CM

## 2017-11-16 DIAGNOSIS — I5022 Chronic systolic (congestive) heart failure: Secondary | ICD-10-CM

## 2017-11-16 DIAGNOSIS — R1310 Dysphagia, unspecified: Secondary | ICD-10-CM

## 2017-11-16 LAB — MAGNESIUM: MAGNESIUM: 1.7 mg/dL (ref 1.7–2.4)

## 2017-11-16 MED ORDER — POTASSIUM CHLORIDE 10 MEQ/100ML IV SOLN
10.0000 meq | INTRAVENOUS | Status: AC
  Administered 2017-11-16 (×4): 10 meq via INTRAVENOUS
  Filled 2017-11-16 (×4): qty 100

## 2017-11-16 MED ORDER — LORAZEPAM 2 MG/ML IJ SOLN
1.0000 mg | Freq: Once | INTRAMUSCULAR | Status: AC
  Administered 2017-11-16: 1 mg via INTRAVENOUS

## 2017-11-16 MED ORDER — LORAZEPAM 2 MG/ML IJ SOLN
INTRAMUSCULAR | Status: AC
  Administered 2017-11-16: 1 mg via INTRAVENOUS
  Filled 2017-11-16: qty 1

## 2017-11-16 MED ORDER — JEVITY 1.2 CAL PO LIQD
1000.0000 mL | ORAL | Status: DC
Start: 2017-11-16 — End: 2017-11-16

## 2017-11-16 MED ORDER — GLUCERNA 1.2 CAL PO LIQD
1000.0000 mL | ORAL | Status: DC
  Filled 2017-11-16 (×2): qty 1000

## 2017-11-16 MED ORDER — GLYCOPYRROLATE 0.2 MG/ML IJ SOLN
0.1000 mg | Freq: Three times a day (TID) | INTRAMUSCULAR | Status: DC
  Administered 2017-11-16 – 2017-11-18 (×6): 0.1 mg via INTRAVENOUS
  Filled 2017-11-16 (×6): qty 1

## 2017-11-16 NOTE — Progress Notes (Signed)
Cortrak Team Note   Attempted to place cotrak tube today; unable to advance tube past the GE junction around 55cm. Recommend IR placement of a small bore feeding tube.   Betsey Holidayasey Tarique Loveall MS, RD, LDN Pager #- [phone] After Hours Pager: [phone]

## 2017-11-16 NOTE — Progress Notes (Signed)
Green safety mitts applied to patient's hands to prevent trying to pull out cortrak.  Pt made aware of why but am unable to know if comprehends reasons.  Granddaughter at bedside and was instructed in why we use bridle and green mitts and how they are designed.  Verbalized understanding.

## 2017-11-16 NOTE — Progress Notes (Signed)
NEUROHOSPITALISTS STROKE TEAM - DAILY PROGRESS NOTE   ADMISSION HISTORY: Aaron Rivera is a 83 y.o. male past medical history of CHF, recent discharge for an STEMI and acute on chronic congestive heart failure, chronic kidney disease 3, diabetes, hypertension, presents to the emergency room via EMS as an acute code stroke.  Last seen normal last night before he went to bed witnessed by family, did not wake up this morning and when family went to check they found him on the floor unresponsive.  He was not able to talk or move the right side of his body. 911 was called and patient was brought into the emergency room at [facility] for further evaluation. Patient CBG on site was greater than 200.  His systolic blood pressure was in the range of 130-160.  His heart rate was 65. He had leftward gaze preference on initial EMS examination along right-sided hemiplegia. At baseline he lives independently, does not cook but eats outside where he goes by himself, still driving.  LKW: 11:30 PM on 11/13/2017 tpa given?: no, outside the window Premorbid modified Rankin scale (mRS): 0 NIH stroke scale 26  SUBJECTIVE (INTERVAL HISTORY) No family is at the bedside during rounds but I spoke to Dr Butler Denmarkizwan and his RN. Patient is found laying in bed in NAD. No new events reported overnight. Patient is awake, does not follow any commands, globally aphasic.  Will grunt and moan when questions are asked.  OBJECTIVE Lab Results: CBC:  Recent Labs  Lab 31-Aug-2017 1151 31-Aug-2017 1202  WBC 6.4  --   HGB 10.8* 11.6*  HCT 33.8* 34.0*  MCV 93.1  --   PLT 101*  --    BMP: Recent Labs  Lab 31-Aug-2017 1151 31-Aug-2017 1202 11/15/17 0742 11/16/17 0956  NA 143 147* 144  --   K 3.5 3.5 3.3*  --   CL 108 110 109  --   CO2 23  --  22  --   GLUCOSE 188* 188* 186*  --   BUN 32* 31* 27*  --   CREATININE 2.66* 2.60* 2.50*  --   CALCIUM 8.7*  --  8.5*  --   MG  --    --   --  1.7   Liver Function Tests:  Recent Labs  Lab 31-Aug-2017 1151  AST 108*  ALT 152*  ALKPHOS 112  BILITOT 1.3*  PROT 7.4  ALBUMIN 3.0*   Coagulation Studies:  Recent Labs    31-Aug-2017 1151  APTT 32  INR 1.49   PHYSICAL EXAM Temp:  [97.8 F (36.6 C)-98.9 F (37.2 C)] 97.8 F (36.6 C) (12/27 1223) Pulse Rate:  [66-91] 83 (12/27 1223) Resp:  [15-32] 26 (12/27 1223) BP: (132-151)/(60-125) 132/78 (12/27 1223) SpO2:  [96 %-100 %] 97 % (12/27 1223) General: Patient is awake, alert in no distress not following commands HEENT: Normocephalic,  Lungs clear to auscultation everywhere with the exception of right upper lobe Extremities: 1+ edema Neurological exam Patient is awake,  does not follow any commands. He is globally aphasic, will occasionally moan , grunt and nod his head when questions are asked Cranial nerves: Pupils equal round reactive to light, left gaze preference-able to come to midline but cannot gaze to the right, no blink to threat from the right, right facial droop of the lower face. Motor exam: Flaccid right upper extremity, minimal movement to noxious stimulus on the right lower extremity, nearly full strength on both left upper and lower extremity. Sensory exam:  Dense sensory loss on the right to noxious stimulus. Coordination cannot be assessed Gait exam was deferred  IMAGING: I have personally reviewed the radiological images below and agree with the radiology interpretations.  Ct Angio Head and Neck W Or Wo Contrast Result Date: 11/02/2017 IMPRESSION: Emergent large vessel occlusion of the LEFT ICA and LEFT MCA territory. No significant collateral flow to the LEFT hemisphere either RIGHT-to-LEFT or from the posterior circulation. High-grade stenosis of the RIGHT internal carotid artery, 90% or greater. Possible intraluminal thrombus (versus eccentric posterior wall soft plaque) without evidence for distal emboli. Estimated 75-90% stenosis Proximal LEFT  internal carotid artery, above which there is poor opacification due to the intracranial occlusion.  Mr Brain Wo Contrast Result Date: 11/03/2017 IMPRESSION: 1. Acute left MCA territory infarct without hemorrhage. 2. Punctate acute infarct in the right cerebellum. 3. Known left ICA and MCA occlusion. Clot is also present in the left posterior communicating artery.   Ct Cerebral Perfusion W Contrast Result Date: 11/07/2017 IMPRESSION: CT brain perfusion findings indicating a large core MCA territory infarct of 118 mL. There is a moderate penumbra of 60 mL surrounding the core area of ischemia.   Ct Head Code Stroke Wo Contrast Result Date: 11/03/2017 IMPRESSION: CT head revealing emergent large vessel occlusion affecting the LEFT ICA and LEFT M1/M2 MCA. Code stroke ASPECTS score of 6.  CT perfusion to follow. Cervical spondylosis, with multilevel degenerative change and stenosis, which appears similar to prior MR cervical of 06/30/2017. Suspected RIGHT lung aspiration pneumonia.  Large RIGHT effusion.  Echocardiogram:  Study Conclusions - Left ventricle: The cavity size was normal. Wall thickness was   normal. Systolic function was moderately to severely reduced. The   estimated ejection fraction was in the range of 30% to 35%.   Diffuse hypokinesis. The study is not technically sufficient to   allow evaluation of LV diastolic function. - Aortic valve: There was trivial regurgitation. - Left atrium: The atrium was mildly dilated. Impressions: - Technically difficult; definity used; moderate to severe global   reduction in LV systolic function; trace AI and MR; mild LAE     ASSESSMENT: Aaron Rivera is a 83 y.o. male with PMH of CHF, recent discharge for an STEMI and acute on chronic congestive heart failure, chronic kidney disease 3, diabetes, hypertension who presented for evaluation of right-sided weakness, aphasia and leftward gaze. His exam is consistent with a complete left  MCA syndrome with an NIH stroke scale of 26. Outside the window for IV TPA. Has a large vessel occlusion but not a candidate for endovascular therapy because of the large volume core in the left MCA territory. At this time, does have high-grade right ICA stenosis but would not recommend any acute intervention at this time.   Acute, large core, left MCA territory infarct without hemorrhage Punctate acute infarct in the right cerebellum Known left ICA and MCA occlusion Clot is also present in the left posterior communicating artery.   STROKE:  Suspected Etiology: cardio embolic versus atheroembolic Resultant Symptoms: Aphasia, dysphasia, right-sided hemiplegia Stroke Risk Factors: diabetes mellitus, hyperlipidemia and hypertension Other Stroke Risk Factors: Advanced age, CKD, CHF, Recent STEMI  Outstanding Stroke Work-up Studies:    Workup completed  11/16/2017: Patient remains with significant aphasia, dysphasia and right hemiplegia.  Dr Pearlean BrownieSethi had long discussion with family at bedside.  Reviewed all imaging labs and plan of care.  Family likely deciding to make patient DNR and comfort care.  Palliative care has been consulted.  PLAN  11/16/2017: Continue Aspirin/ Statin Frequent neuro checks Telemetry monitoring PT/OT/SLP Consult Case Management /MSW Ongoing aggressive stroke risk factor management Follow up with [facility] in 6 weeks, likely not appropriate at this time  INTRACRANIAL Atherosclerosis &Stenosis: DAPT therapy not appropriate at this time  DYSPHAGIA: NPO until passes SLP swallow evaluation Aspiration Precautions in progress  CEREBRAL EDEMA: No need for 3% NS Infusion at this time, comfort care in progress  HYPERTENSION: Stable Permissive hypertension (OK if <220/120) for 24-48 hours post stroke and then gradually normalized within 5-7 days. Nicardipine drip, Labetolol PRN Long term BP goal normotensive. May slowly restart home B/P medications  after 48 hours  HYPERLIPIDEMIA:    Component Value Date/Time   CHOL 127 11/15/2017 0518   CHOL 165 03/27/2017 0946   TRIG 104 11/15/2017 0518   HDL 29 (L) 11/15/2017 0518   HDL 31 (L) 03/27/2017 0946   CHOLHDL 4.4 11/15/2017 0518   VLDL 21 11/15/2017 0518   LDLCALC 77 11/15/2017 0518   LDLCALC 111 (H) 03/27/2017 0946  Home Meds:  Crestor 20 mg LDL  goal < 70 Continued on Crestor 20 mg daily Continue statin at discharge, if appopriate  DIABETES: Lab Results  Component Value Date   HGBA1C 6.2 (H) 11/15/2017  HgbA1c goal < 7.0  Other Active Problems: Principal Problem:   Acute ischemic left MCA stroke (HCC) Active Problems:   AAA (abdominal aortic aneurysm) (HCC)   AI (aortic incompetence)   Benign prostatic hyperplasia with lower urinary tract symptoms   Essential (primary) hypertension   HLD (hyperlipidemia)   Diabetes mellitus, type 2 (HCC)   Carotid stenosis   Chronic systolic CHF (congestive heart failure) (HCC)   Multifocal pneumonia   Left middle cerebral artery stroke (HCC)   Palliative care by specialist   DNR (do not resuscitate)  Hospital day # 2 VTE prophylaxis: SCD's  Diet : Diet NPO time specified   FAMILY UPDATES: No family at bedside  TEAM UPDATES: Calvert Cantor, MD STATUS:  DNR  Palliative care DNI/DNR/comfort measures in progress     Prior Home Stroke Medications:  No antithrombotic  Discharge Stroke Meds:  Please discharge patient on aspirin 325 mg daily   Disposition: 01-Home or Self Care Therapy Recs:               PENDING Home Equipment:         PENDING Follow Up:  Follow-up Information    Micki Riley, MD. Schedule an appointment as soon as possible for a visit in 6 week(s).   Specialties:  Neurology, Radiology Why:  Gilford Raid APPROPRIATE Contact information: [address] [place] [address] [phone]          Maple Hudson., MD -PCP Follow up in 1-2 weeks     Brita Romp Stroke  Neurology Team 11/16/2017 1:55 PM I have personally examined this patient, reviewed notes, independently viewed imaging studies, participated in medical decision making and plan of care.ROS completed by me personally and pertinent positives fully documented  I have made any additions or clarifications directly to the above note. A   He has presented with large left MCA infarct due to left carotid occlusion and prognosis is very poor with likely impending cytotoxic cerebral edema, brain herniation and a very poor outcome even with aggressive life support.  I had a long discussion with his daughters and other family members on 11/15/17 and they understood the poor prognosis and agreed  to DNR and likely comfort care soon but await arrival of other family members and discussion with them. D/w Dr Butler Denmark This patient is critically ill and at significant risk of neurological worsening, death and care requires constant monitoring of vital signs, hemodynamics,respiratory and cardiac monitoring, extensive review of multiple databases, frequent neurological assessment, discussion with family, other specialists and medical decision making of high complexity.I have made any additions or clarifications directly to the above note.This critical care time does not reflect procedure time, or teaching time or supervisory time of PA/NP/Med Resident etc but could involve care discussion time.  I spent 30 minutes of neurocritical care time  in the care of  this patient.      Delia Heady, MD Medical Director [facility] Pager: [phone] 11/16/2017 1:55 PM  Neurology to sign-off at this time. Please call with any further questions or concerns. Thank you for this consultation.  To contact Stroke Continuity provider, please refer to [email or web address]. After hours, contact General Neurology

## 2017-11-16 NOTE — Progress Notes (Addendum)
PROGRESS NOTE    Aaron Oberhomas H Veith   WUJ:[id number]  DOB: 01/13/1930  DOA: 03/26/17 PCP: Maple HudsonGilbert, Richard L Jr., MD   Brief Narrative:   Aaron Rivera is a 83 y.o. male with medical history significant for hypertension, systolic and dCHF,hyperlipidemia,AAA, andNIDDM who presents on 03/26/17 after being found down by family for unknown period of time.  Patient presents to the ED as a code stroke with left sided gaze preference and right sided weakness.  Patient's last known normal was the night prior to admission.    Imaging reveals a large left MCA infarct.   [facility] where he was admitted for treatment of CHF exacerbation. Prior to this infarct, he was quite independent.  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   Punctate acute infarct in the right cerebellum.  Known left ICA and MCA occlusion. Clot is also present in the left posterior communicating artery  - The patient is a high aspiration risk and can no longer swallow or talk. Despite being able to no longer communicate, he is alert, understands and follows commands - at this time the family would like to monitor for a few days for him to stabilize from stroke perspective and then decide if they prefer comfort care vs total care- they are wanting to go ahead and place an NG for now while they are deciding- palliative care plans to follow up with family on Sunday - NG tube ordered but cor trak team had difficulty with getting the tip into the stomach- have requested IR to place - dietician consulted to  advise on tube feeds - on slow IVF (has CHF) while NPO - have requested PT/OT consults agan  Active Problems:   Multifocal pneumonia - high suspicion of aspiration- high risk for acute respiratory failure from aspiration - he had a recent hospital stay and is being treated as HCAP with Vanc and Zosyn - MRSA PCR is + and therefore will continue Vanc for now - currently, respiratory status is quite stable and he has been weaned off of O2  Hypokalemia - replace via IV- K also in replacement fluid  Mild troponin elevation - 0.10- he had mild elevated troponin in the past- possibly chronic    AAA (abdominal aortic aneurysm) - has aortoiliac stent    Benign prostatic hyperplasia  - holding Terzaosin as NPO and high aspiration risk - follow urine output- follow for retention    Essential (primary) hypertension - allow for permissive HTN    HLD (hyperlipidemia) - hold Crestor    Diabetes mellitus, type 2  - hold Actos- SSI for now - A1c is 6.2    Chronic systolic CHF (congestive heart failure) - EF is 30% with grade 2dCHF per ECHO in 12/14 - [facility] - weight on discharge was 232 and currently is 204 and therefore is below dry weigth - no signs of fluid overload on exam - hold Lasix- on slow hydration as he is NPO  CKD 4 - stable   DVT prophylaxis: Heparin Code Status: Full code Family Communication:  Disposition Plan: follow in SDU Consultants:   Neuro/stroke team Procedures:   2 D ECHO Antimicrobials:  Anti-infectives (From admission, onward)   Start     Dose/Rate Route Frequency Ordered Stop   11/15/17 0600  vancomycin (VANCOCIN) IVPB 750 mg/150 ml premix     750 mg 150 mL/hr over 60 Minutes Intravenous Every 48 hours 11/15/17 0137     11/15/17 0200  piperacillin-tazobactam (ZOSYN) IVPB 3.375 g     3.375 g 12.5 mL/hr over 240 Minutes Intravenous Every 8 hours 11/15/17 0045     11-24-17 1415  vancomycin (VANCOCIN) IVPB 1000  mg/200 mL premix     1,000 mg 200 mL/hr over 60 Minutes Intravenous  Once 11/24/17 1410 11/24/2017 1630   2017-11-24 1415  piperacillin-tazobactam (ZOSYN) IVPB 3.375 g     3.375 g 100 mL/hr over 30 Minutes Intravenous  Once 11-24-2017 1410 2017/11/24 1524       Objective: Vitals:   11/16/17 0900 11/16/17 1000 11/16/17 1100 11/16/17 1223  BP: (!) 150/90 (!) 145/125 (!) 146/86 132/78  Pulse:  91 84 83  Resp: (!) 23 17 (!) 26 (!) 26  Temp:    97.8 F (36.6 C)  TempSrc:    Oral  SpO2:  97% 98% 97%  Weight:      Height:        Intake/Output Summary (Last 24 hours) at 11/16/2017 1454 Last data filed at 11/16/2017 1404 Gross per 24 hour  Intake 1100 ml  Output 2275 ml  Net -1175 ml   Filed Weights   11/24/17 1201 11/24/17 1701  Weight: 95.5 kg (210 lb 8.6 oz) 92.9 kg (204 lb 12.9 oz)    Examination: General exam: Appears comfortable - cannot talk HEENT: PERRLA, no sclera icterus or thrush- will not open mouth Respiratory system: Clear to auscultation. Respiratory effort normal. Cardiovascular system: S1 & S2 heard, RRR.  No murmurs  Gastrointestinal system: Abdomen soft, non-tender, nondistended. Normal bowel sound. No organomegaly Central nervous system: Alert  - able to move left side and follow commands- no movement on the right  Extremities: No cyanosis, clubbing or edema Skin: No rashes or ulcers Psychiatry:   unable to assess mood & affect      Data Reviewed: I have personally reviewed following labs and imaging studies  CBC: Recent Labs  Lab 2017/11/24 1151 11-24-17 1202  WBC 6.4  --   NEUTROABS 4.0  --   HGB 10.8* 11.6*  HCT 33.8* 34.0*  MCV 93.1  --   PLT 101*  --    Basic Metabolic Panel: Recent Labs  Lab 24-Nov-2017 1151 11/24/2017 1202 11/15/17 0742 11/16/17 0956  NA 143 147* 144  --   K 3.5 3.5 3.3*  --   CL 108 110 109  --   CO2 23  --  22  --   GLUCOSE 188* 188* 186*  --   BUN 32* 31* 27*  --   CREATININE 2.66* 2.60* 2.50*  --   CALCIUM 8.7*  --   8.5*  --   MG  --   --   --  1.7   GFR: Estimated Creatinine Clearance: 24.8 mL/min (A) (by C-G formula based on SCr of 2.5 mg/dL (H)). Liver Function Tests: Recent Labs  Lab Nov 24, 2017 1151  AST 108*  ALT 152*  ALKPHOS 112  BILITOT 1.3*  PROT 7.4  ALBUMIN 3.0*   No results for  input(s): LIPASE, AMYLASE in the last 168 hours. No results for input(s): AMMONIA in the last 168 hours. Coagulation Profile: Recent Labs  Lab 11/07/2017 1151  INR 1.49   Cardiac Enzymes: No results for input(s): CKTOTAL, CKMB, CKMBINDEX, TROPONINI in the last 168 hours. BNP (last 3 results) No results for input(s): PROBNP in the last 8760 hours. HbA1C: Recent Labs    11/15/17 0518  HGBA1C 6.2*   CBG: Recent Labs  Lab 10/29/2017 1153  GLUCAP 173*   Lipid Profile: Recent Labs    11/15/17 0518  CHOL 127  HDL 29*  LDLCALC 77  TRIG 454  CHOLHDL 4.4   Thyroid Function Tests: No results for input(s): TSH, T4TOTAL, FREET4, T3FREE, THYROIDAB in the last 72 hours. Anemia Panel: No results for input(s): VITAMINB12, FOLATE, FERRITIN, TIBC, IRON, RETICCTPCT in the last 72 hours. Urine analysis: No results found for: COLORURINE, APPEARANCEUR, LABSPEC, PHURINE, GLUCOSEU, HGBUR, BILIRUBINUR, KETONESUR, PROTEINUR, UROBILINOGEN, NITRITE, LEUKOCYTESUR Sepsis Labs: @LABRCNTIP (procalcitonin:4,lacticidven:4) ) Recent Results (from the past 240 hour(s))  MRSA PCR Screening     Status: Abnormal   Collection Time: 11/09/2017  5:44 PM  Result Value Ref Range Status   MRSA by PCR POSITIVE (A) NEGATIVE Final    Comment:        The GeneXpert MRSA Assay (FDA approved for NASAL specimens only), is one component of a comprehensive MRSA colonization surveillance program. It is not intended to diagnose MRSA infection nor to guide or monitor treatment for MRSA infections. RESULT CALLED TO, READ BACK BY AND VERIFIED WITH: RN A DAVIS [id number] [phone] [facility]          Radiology Studies: Ct Angio Head W Or Wo  Contrast  Result Date: 11/19/2017 CLINICAL DATA:  RIGHT-sided weakness.  Found down. EXAM: CT ANGIOGRAPHY HEAD AND NECK TECHNIQUE: Multidetector CT imaging of the head and neck was performed using the standard protocol during bolus administration of intravenous contrast. Multiplanar CT image reconstructions and MIPs were obtained to evaluate the vascular anatomy. Carotid stenosis measurements (when applicable) are obtained utilizing NASCET criteria, using the distal internal carotid diameter as the denominator. CONTRAST:  50mL ISOVUE-370 IOPAMIDOL (ISOVUE-370) INJECTION 76% COMPARISON:  CT perfusion reported earlier. FINDINGS: CTA NECK Aortic arch: Standard branching. Imaged portion shows no evidence of aneurysm or dissection. No significant stenosis of the major arch vessel origins. Transverse arch calcifications. Right carotid system: High-grade stenosis, estimated 90% or greater in the proximal internal carotid artery due to a combination of calcific plaque and posterior wall soft plaque. There maybe intraluminal thrombus, see image 182 series 6. Left carotid system: Estimated 75-90% stenosis due to combination of calcific and posterior wall soft plaque. There is poor enhancement of the distal cervical ICA due to the intracranial vascular occlusion. Vertebral arteries: Codominant. BILATERAL ostial calcific plaque, 75% or greater. Nonvascular soft tissues: RIGHT upper lobe consolidation, suspected aspiration pneumonia. Large RIGHT pleural effusion. Advanced spondylosis. CTA HEAD Anterior circulation: Calcification of the cavernous internal carotid arteries consistent with cerebrovascular atherosclerotic disease. There is occlusion of the LEFT internal carotid artery at the skullbase, extending to the ICA terminus. No significant filling of the LEFT M1 MCA, LEFT M2 bifurcation segments, or LEFT M3 branches over the convexity. Calcific 50-75% stenosis of the cavernous ICA on the RIGHT. No RIGHT M1 or M2  flow-limiting stenosis. Both anterior cerebral arteries are patent, but attempts of RIGHT-to-LEFT collateral circulation appear ineffective. Posterior circulation: Both vertebral arteries as well as the basilar artery are patent. Attempts at collateral flow via the LEFT PCom  appear ineffective. No significant stenosis, proximal occlusion, aneurysm, or vascular malformation. Venous sinuses: As permitted by contrast timing, patent. Anatomic variants: None of significance. Delayed phase:  Not performed. Review of the MIP images confirms the above findings IMPRESSION: Emergent large vessel occlusion of the LEFT ICA and LEFT MCA territory. No significant collateral flow to the LEFT hemisphere either RIGHT-to-LEFT or from the posterior circulation. High-grade stenosis of the RIGHT internal carotid artery, 90% or greater. Possible intraluminal thrombus (versus eccentric posterior wall soft plaque) without evidence for distal emboli. Estimated 75-90% stenosis Proximal LEFT internal carotid artery, above which there is poor opacification due to the intracranial occlusion. These results were called by telephone at the time of interpretation on 10/24/2017 at 1:06 pm to Dr. Milon Dikes , who verbally acknowledged these results. Electronically Signed   By: Elsie Stain M.D.   On: 11/01/2017 13:07   Ct Angio Neck W Or Wo Contrast  Result Date: 11/17/2017 CLINICAL DATA:  RIGHT-sided weakness.  Found down. EXAM: CT ANGIOGRAPHY HEAD AND NECK TECHNIQUE: Multidetector CT imaging of the head and neck was performed using the standard protocol during bolus administration of intravenous contrast. Multiplanar CT image reconstructions and MIPs were obtained to evaluate the vascular anatomy. Carotid stenosis measurements (when applicable) are obtained utilizing NASCET criteria, using the distal internal carotid diameter as the denominator. CONTRAST:  50mL ISOVUE-370 IOPAMIDOL (ISOVUE-370) INJECTION 76% COMPARISON:  CT perfusion  reported earlier. FINDINGS: CTA NECK Aortic arch: Standard branching. Imaged portion shows no evidence of aneurysm or dissection. No significant stenosis of the major arch vessel origins. Transverse arch calcifications. Right carotid system: High-grade stenosis, estimated 90% or greater in the proximal internal carotid artery due to a combination of calcific plaque and posterior wall soft plaque. There maybe intraluminal thrombus, see image 182 series 6. Left carotid system: Estimated 75-90% stenosis due to combination of calcific and posterior wall soft plaque. There is poor enhancement of the distal cervical ICA due to the intracranial vascular occlusion. Vertebral arteries: Codominant. BILATERAL ostial calcific plaque, 75% or greater. Nonvascular soft tissues: RIGHT upper lobe consolidation, suspected aspiration pneumonia. Large RIGHT pleural effusion. Advanced spondylosis. CTA HEAD Anterior circulation: Calcification of the cavernous internal carotid arteries consistent with cerebrovascular atherosclerotic disease. There is occlusion of the LEFT internal carotid artery at the skullbase, extending to the ICA terminus. No significant filling of the LEFT M1 MCA, LEFT M2 bifurcation segments, or LEFT M3 branches over the convexity. Calcific 50-75% stenosis of the cavernous ICA on the RIGHT. No RIGHT M1 or M2 flow-limiting stenosis. Both anterior cerebral arteries are patent, but attempts of RIGHT-to-LEFT collateral circulation appear ineffective. Posterior circulation: Both vertebral arteries as well as the basilar artery are patent. Attempts at collateral flow via the LEFT PCom appear ineffective. No significant stenosis, proximal occlusion, aneurysm, or vascular malformation. Venous sinuses: As permitted by contrast timing, patent. Anatomic variants: None of significance. Delayed phase:  Not performed. Review of the MIP images confirms the above findings IMPRESSION: Emergent large vessel occlusion of the LEFT ICA  and LEFT MCA territory. No significant collateral flow to the LEFT hemisphere either RIGHT-to-LEFT or from the posterior circulation. High-grade stenosis of the RIGHT internal carotid artery, 90% or greater. Possible intraluminal thrombus (versus eccentric posterior wall soft plaque) without evidence for distal emboli. Estimated 75-90% stenosis Proximal LEFT internal carotid artery, above which there is poor opacification due to the intracranial occlusion. These results were called by telephone at the time of interpretation on 10/30/2017 at 1:06 pm to Dr. Beatrix Fetters  ARORA , who verbally acknowledged these results. Electronically Signed   By: Elsie Stain M.D.   On: 11/17/2017 13:07   Ct Cervical Spine Wo Contrast  Result Date: 11/04/2017 CLINICAL DATA:  RIGHT-sided weakness. Symptom duration unspecified. Concern for acute infarction. EXAM: CT HEAD WITHOUT CONTRAST CT CERVICAL SPINE WITHOUT CONTRAST TECHNIQUE: Multidetector CT imaging of the head and cervical spine was performed following the standard protocol without intravenous contrast. Multiplanar CT image reconstructions of the cervical spine were also generated. COMPARISON:  MRI cervical spine 06/30/2017. FINDINGS: CT HEAD FINDINGS Brain: Multifocal areas of cortical hypoattenuation on the LEFT, as well as loss of the insular ribbon, concerning for acute infarction. No hemorrhage, mass lesion, or extra-axial fluid. Generalized atrophy with hydrocephalus ex vacuo. Vascular: Large vessel occlusion, with dense LEFT ICA and LEFT M1/M2 MCA branches. Skull: Normal. Negative for fracture or focal lesion. Sinuses/Orbits: No acute finding. Other: None. ASPECTS score = 6. Cytotoxic edema of the insula, M2, possible M4 and M6 zones. [place] Stroke Program Early CT Score Normal score = 10 CT CERVICAL SPINE FINDINGS Alignment: Anatomic Skull base and vertebrae: No acute fracture. No primary bone lesion or focal pathologic process. Soft tissues and spinal canal: Severe  stenosis at C3-4, combination of OPLL and central disc herniation with posterior element hypertrophy. No visible canal hemorrhage. Disc levels: Multilevel spondylosis. This is better evaluated with prior MRI cervical. Upper chest: Dense opacity in the RIGHT upper lobe, favoring consolidation, query aspiration. No similar findings on the LEFT to suggest asymmetric edema. RIGHT pleural effusion. Other: None. IMPRESSION: CT head revealing emergent large vessel occlusion affecting the LEFT ICA and LEFT M1/M2 MCA. Code stroke ASPECTS score of 6.  CT perfusion to follow. Cervical spondylosis, with multilevel degenerative change and stenosis, which appears similar to prior MR cervical of 06/30/2017. Suspected RIGHT lung aspiration pneumonia.  Large RIGHT effusion. These results were called by telephone at the time of interpretation on 10/29/2017 at 12:20 pm to Dr. Wilford Corner, who verbally acknowledged these results. Electronically Signed   By: Elsie Stain M.D.   On: 11/04/2017 12:34   Ct Pelvis Wo Contrast  Result Date: 10/27/2017 CLINICAL DATA:  Patient found lying on floor on right side this morning unresponsive. Right hip pain. EXAM: CT PELVIS WITHOUT CONTRAST TECHNIQUE: Multidetector CT imaging of the pelvis was performed following the standard protocol without intravenous contrast. COMPARISON:  09/05/2013 FINDINGS: Urinary Tract:  Ureters and bladder are within normal. Bowel: Mild diverticulosis of the colon. Appendix is normal. Visualized small bowel is normal. Vascular/Lymphatic: Aortoiliac stent graft exclude a native abdominal aneurysm measuring 6.8 x 7 cm in AP and transverse dimension unchanged. Moderate calcified plaque over the iliac and femoral arteries. No adenopathy. Reproductive:  Within normal. Other:  No free fluid or focal inflammatory change. Musculoskeletal: Mild degenerate change of the spine and hips. No evidence of acute hip fracture or dislocation. IMPRESSION: No acute fracture. Aortoiliac stent  graft excluding a native abdominal aortic aneurysm measuring 6.8 x 7 cm unchanged. Diverticulosis of the colon. Electronically Signed   By: Elberta Fortis M.D.   On: 11/13/2017 12:50   Mr Brain Wo Contrast  Result Date: 11/20/2017 CLINICAL DATA:  Follow-up code stroke yesterday. EXAM: MRI HEAD WITHOUT CONTRAST TECHNIQUE: Multiplanar, multiecho pulse sequences of the brain and surrounding structures were obtained without intravenous contrast. COMPARISON:  Head CT and CTA from earlier today. FINDINGS: Brain: Essentially the entire cortex of the left MCA distribution is infarcted. There is good correlation with previous CT perfusion. Punctate  acute infarct in the right cerebellum. No acute hemorrhage. There is atrophy with ventriculomegaly. Chronic small vessel ischemia with confluent gliosis in the periventricular white matter. Vascular: Loss of left ICA and MCA flow void. The left posterior communicating artery also appears to contain clot. The left posterior communicating artery is large, with hypoplastic left P1 segment. Skull and upper cervical spine: Negative for marrow lesion. Sinuses/Orbits: No acute finding.  Bilateral cataract resection. IMPRESSION: 1. Acute left MCA territory infarct without hemorrhage. 2. Punctate acute infarct in the right cerebellum. 3. Known left ICA and MCA occlusion. Clot is also present in the left posterior communicating artery. Electronically Signed   By: Marnee Spring M.D.   On: 12-Dec-2017 16:55   Ct Cerebral Perfusion W Contrast  Result Date: 2017-12-12 CLINICAL DATA:  RIGHT-sided weakness, found down.Last seen normal 12:30 a.m. earlier today. EXAM: CT PERFUSION BRAIN TECHNIQUE: Multiphase CT imaging of the brain was performed following IV bolus contrast injection. Subsequent parametric perfusion maps were calculated using RAPID software. CONTRAST:  40mL ISOVUE-370 IOPAMIDOL (ISOVUE-370) INJECTION 76% COMPARISON:  Code stroke CT earlier today. CTA head neck reported  separately. FINDINGS: CT Brain Perfusion Findings: CBF (<30%) Volume: Perfusion (Tmax>6.0s) volume: Mismatch Volume: 60mL Infarction Location:A large portion of the LEFT MCA territory, including frontal, temporal, insula, and anterior parietal lobes, LEFT basal ganglia, and adjacent white matter. IMPRESSION: CT brain perfusion findings indicating a large core MCA territory infarct of 118 mL. There is a moderate penumbra of 60 mL surrounding the core area of ischemia. See discussion above. These results were called by telephone at the time of interpretation on December 12, 2017 at 12:32 pm to Dr. Milon Dikes , who verbally acknowledged these results. Electronically Signed   By: Elsie Stain M.D.   On: December 12, 2017 12:39   Dg Chest Port 1 View  Result Date: December 12, 2017 CLINICAL DATA:  Airspace opacity at the right lung apex for further characterization. EXAM: PORTABLE CHEST 1 VIEW COMPARISON:  2017-12-12 CT neck FINDINGS: Bilateral airspace opacities, right greater than left, with particular consolidation at the right lung apex and also in the right infrahilar region. Hazy density over the right hemithorax likely represents layering of the known right pleural effusion. Atherosclerotic calcification of the aortic arch. Mild cardiomegaly. Streaky left perihilar and basilar opacities. IMPRESSION: 1. Right greater than left airspace opacities especially in the infrahilar and right apical regions, possibilities include asymmetric edema, multilobar pneumonia, and aspiration pneumonia. 2. Lesser streaky opacities in the left perihilar region and left lower lobe. 3. Mild enlargement of the cardiopericardial silhouette. 4. Known layering right pleural effusion. 5.  Aortic Atherosclerosis (ICD10-I70.0). Electronically Signed   By: Gaylyn Rong M.D.   On: 2017/12/12 13:36   Ct Head Code Stroke Wo Contrast  Result Date: 12/12/2017 CLINICAL DATA:  RIGHT-sided weakness. Symptom duration unspecified. Concern for  acute infarction. EXAM: CT HEAD WITHOUT CONTRAST CT CERVICAL SPINE WITHOUT CONTRAST TECHNIQUE: Multidetector CT imaging of the head and cervical spine was performed following the standard protocol without intravenous contrast. Multiplanar CT image reconstructions of the cervical spine were also generated. COMPARISON:  MRI cervical spine 06/30/2017. FINDINGS: CT HEAD FINDINGS Brain: Multifocal areas of cortical hypoattenuation on the LEFT, as well as loss of the insular ribbon, concerning for acute infarction. No hemorrhage, mass lesion, or extra-axial fluid. Generalized atrophy with hydrocephalus ex vacuo. Vascular: Large vessel occlusion, with dense LEFT ICA and LEFT M1/M2 MCA branches. Skull: Normal. Negative for fracture or focal lesion. Sinuses/Orbits: No acute finding. Other: None. ASPECTS score =  6. Cytotoxic edema of the insula, M2, possible M4 and M6 zones. [place] Stroke Program Early CT Score Normal score = 10 CT CERVICAL SPINE FINDINGS Alignment: Anatomic Skull base and vertebrae: No acute fracture. No primary bone lesion or focal pathologic process. Soft tissues and spinal canal: Severe stenosis at C3-4, combination of OPLL and central disc herniation with posterior element hypertrophy. No visible canal hemorrhage. Disc levels: Multilevel spondylosis. This is better evaluated with prior MRI cervical. Upper chest: Dense opacity in the RIGHT upper lobe, favoring consolidation, query aspiration. No similar findings on the LEFT to suggest asymmetric edema. RIGHT pleural effusion. Other: None. IMPRESSION: CT head revealing emergent large vessel occlusion affecting the LEFT ICA and LEFT M1/M2 MCA. Code stroke ASPECTS score of 6.  CT perfusion to follow. Cervical spondylosis, with multilevel degenerative change and stenosis, which appears similar to prior MR cervical of 06/30/2017. Suspected RIGHT lung aspiration pneumonia.  Large RIGHT effusion. These results were called by telephone at the time of  interpretation on 29-Nov-2016 at 12:20 pm to Dr. Wilford CornerARORA, who verbally acknowledged these results. Electronically Signed   By: Elsie StainJohn T Curnes M.D.   On: 29-Nov-2016 12:34      Scheduled Meds: . aspirin  300 mg Rectal Daily   Or  . aspirin  325 mg Oral Daily  . chlorhexidine  15 mL Mouth Rinse BID  . Chlorhexidine Gluconate Cloth  6 each Topical Q0600  . glycopyrrolate  0.1 mg Intravenous TID  . heparin  5,000 Units Subcutaneous Q8H  . mouth rinse  15 mL Mouth Rinse q12n4p  . mupirocin ointment  1 application Nasal BID  . rosuvastatin  20 mg Oral q1800   Continuous Infusions: . 0.9 % NaCl with KCl 40 mEq / L 75 mL/hr (11/16/17 1218)  . piperacillin-tazobactam (ZOSYN)  IV Stopped (11/16/17 1258)  . vancomycin Stopped (11/15/17 0614)     LOS: 2 days    Time spent in minutes: 45    Calvert CantorSaima Clementine Soulliere, MD [facility] Pager: [email or web address] Password [email or web address] 11/16/2017, 2:54 PM

## 2017-11-16 NOTE — Progress Notes (Addendum)
Pharmacy Antibiotic Note  Aaron Rivera is a 83 y.o. male admitted on 12-27-16 with CVA and PNA.   Day # 3 of antibiotics Scr = 2.5 Afebrile, WBC WNL Blood cultures negative to date   Plan: Vancomycin 750 mg IV q48h Zosyn 3.375 g IV q8h   Consider streamlining - > Augmentin/Unasyn?  Height: 5\' 10"  (177.8 cm) Weight: 204 lb 12.9 oz (92.9 kg) IBW/kg (Calculated) : 73  Temp (24hrs), Avg:98.3 F (36.8 C), Min:97.8 F (36.6 C), Max:98.9 F (37.2 C)  Recent Labs  Lab 11/01/2017 1151 11/01/2017 1202 11/15/17 0742  WBC 6.4  --   --   CREATININE 2.66* 2.60* 2.50*    Estimated Creatinine Clearance: 24.8 mL/min (A) (by C-G formula based on SCr of 2.5 mg/dL (H)).    No Known Allergies  Thank you Okey RegalLisa Gesenia Bantz, PharmD  11/16/2017 12:58 PM

## 2017-11-16 NOTE — Progress Notes (Signed)
Patient ID: Sallye Oberhomas H Marlin, male   DOB: 1930/06/09, 83 y.o.   MRN: [id number]  This NP visited patient at the bedside as a follow up to  yesterday's GOCs meeting with daughters for continued conversation regarding diagnosis, prognosis, goals of care, end-of-life wishes and options  Patient is a bit more alert today and seems to communicate with head nods.  He has large amounts of oral secretions that he cannot handle.     - Robinul 0.1 mg IV TID     -fails bedside swallow   Family understand the described poor prognosis from neurology however at this point in time they need a few more days to see if Mr. Mardella LaymanLindsey will improve in any way.  Plan is to continue to treat the treatable, place a feeding source and hope for improvement.  Family agreed to meet with this nurse practitioner on Sunday at 12 noon to continue conversation and clarify goals of care moving forward.  For now treat the treatable and hope for Improment.  They understand the seriousness of the situation and the reality that Mr. Lillia AbedLindsay may decline at any time, as the family they are prepared to shift to comfort.    Discussed with patient the importance of continued conversation with family and their  medical providers regarding overall plan of care and treatment options,  ensuring decisions are within the context of the patients values and GOCs.  Questions and concerns addressed  Family verbalize concerns regarding volume of visitors.  We discussed limiting visitation to 2 people at a time, and placing hippa restrictions to his information.  Discussed with nursing and they will put in place.  Will re-meet on Sunday at 1200.  PMT will continue to support hoslistically    Family encouraged to call with questions or concerns   Time in 1100          Time out  1200      Total time spent on the unit was 60 minutes   Discussed with Dr. Butler Denmarkizwan and Dr Pearlean BrownieSethi  Greater than 50% of the time was spent in counseling and coordination of  care  Lorinda CreedMary Larach NP  Palliative Medicine Team Team Phone # [phone] Pager [phone]

## 2017-11-16 NOTE — Progress Notes (Addendum)
Initial Nutrition Assessment  DOCUMENTATION CODES:   Not applicable  INTERVENTION:    Once feeding tube placed per IR, initiate Glucerna 1.2 at 20 ml/hr and increase by 10 ml every 4 hours to goal rate of 70 ml/hr  Provides 2016 kcals, 100 gm protein, 1352 ml of free water  NUTRITION DIAGNOSIS:   Inadequate oral intake related to dysphagia(s/p CVA) as evidenced by NPO status  GOAL:   Patient will meet greater than or equal to 90% of their needs  MONITOR:   TF tolerance, Labs, Weight trends, Skin, I & O's  REASON FOR ASSESSMENT:   Consult Enteral/tube feeding initiation and management  ASSESSMENT:   83 y.o. Male with PMH of CHF, recent discharge for an STEMI and acute on chronic congestive heart failure, chronic kidney disease 3, diabetes, hypertension, presents to the emergency room via EMS as an acute code stroke.   CT of head revealed left MCA infarct.   Pt S/P bedside swallow evaluation 12/26. SLP rec NPO status. [facility] Tube Team unable to place feeding tube 12/27. Per KUB, tube tip in pt's esophagus.  Palliative Medicine Team following for goals of care. Labs and medications reviewed. K 3.3 (L). CBG [phone].  NUTRITION - FOCUSED PHYSICAL EXAM:    Most Recent Value  Orbital Region  No depletion  Upper Arm Region  No depletion  Thoracic and Lumbar Region  No depletion  Buccal Region  No depletion  Temple Region  No depletion  Clavicle Bone Region  No depletion  Clavicle and Acromion Bone Region  No depletion  Scapular Bone Region  No depletion  Dorsal Hand  No depletion  Patellar Region  No depletion  Anterior Thigh Region  No depletion  Posterior Calf Region  No depletion  Edema (RD Assessment)  None     Diet Order:  Diet NPO time specified  EDUCATION NEEDS:   No education needs have been identified at this time  Skin:  Skin Assessment: Reviewed RN Assessment  Last BM:  N/A   Intake/Output Summary (Last 24 hours) at 11/16/2017 1529 Last  data filed at 11/16/2017 1404 Gross per 24 hour  Intake 1100 ml  Output 2275 ml  Net -1175 ml   Height:   Ht Readings from Last 1 Encounters:  10/21/2017 5\' 10"  (1.778 m)   Weight:   Wt Readings from Last 1 Encounters:  11/15/2017 204 lb 12.9 oz (92.9 kg)   Ideal Body Weight:  75.4 kg  BMI:  Body mass index is 29.39 kg/m.  Estimated Nutritional Needs:   Kcal:  1900-2100  Protein:  95-110 gm  Fluid:  1.9-2.1 L  Maureen ChattersKatie Marilin Kofman, RD, LDN Pager #: [phone] After-Hours Pager #: [phone]

## 2017-11-16 NOTE — Progress Notes (Signed)
  Speech Language Pathology Treatment: Dysphagia  Patient Details Name: Aaron Rivera H Hodder MRN: [id number] DOB: 1930-06-08 Today's Date: 11/16/2017 Time: 1100-1130 SLP Time Calculation (min) (ACUTE ONLY): 30 min  Assessment / Plan / Recommendation Clinical Impression  F/u after yesterday's initial swallow assessment. Pt alert, continues to have difficulty managing secretions.  Attempted to provide oral care, but it was limited given pt's resistance.  Bloody secretions removed from mouth - source unknown.  Pt provided with ice chips, tspns water - continues to present with delayed swallow, wet cough, poor toleration overall.  Recommend continued NPO.  Pt is consulting with Palliative medicine; they are hoping to treat the treatable at this point and have requested temporary TF.  SLP will continue to follow for dysphagia management, speech eval when pt able.   HPI HPI: 83 y.o.malewith medical history significant forhypertension,systolic and dCHF,hyperlipidemia,AAA, andNIDDMwho presents on 12/25/2018after being found down by family for unknown period of time.  Dx acute ischemic large left MCA and right punctate cerebellarstrokes.  Palliative care has been consulted.       SLP Plan  Continue with current plan of care       Recommendations  Diet recommendations: NPO                Oral Care Recommendations: Oral care QID Follow up Recommendations: Other (comment)(tba) SLP Visit Diagnosis: Dysphagia, unspecified (R13.10) Plan: Continue with current plan of care       GO                Blenda MountsCouture, Renelda Kilian Laurice 11/16/2017, 11:50 AM

## 2017-11-17 ENCOUNTER — Ambulatory Visit: Payer: Medicare HMO | Admitting: Family

## 2017-11-17 ENCOUNTER — Inpatient Hospital Stay (HOSPITAL_COMMUNITY): Payer: Medicare HMO

## 2017-11-17 LAB — BASIC METABOLIC PANEL
ANION GAP: 13 (ref 5–15)
BUN: 22 mg/dL — AB (ref 6–20)
CO2: 20 mmol/L — AB (ref 22–32)
Calcium: 8.2 mg/dL — ABNORMAL LOW (ref 8.9–10.3)
Chloride: 115 mmol/L — ABNORMAL HIGH (ref 101–111)
Creatinine, Ser: 2.41 mg/dL — ABNORMAL HIGH (ref 0.61–1.24)
GFR calc Af Amer: 27 mL/min — ABNORMAL LOW (ref >60)
GFR calc non Af Amer: 23 mL/min — ABNORMAL LOW (ref >60)
GLUCOSE: 162 mg/dL — AB (ref 65–99)
POTASSIUM: 4.4 mmol/L (ref 3.5–5.1)
Sodium: 148 mmol/L — ABNORMAL HIGH (ref 135–145)

## 2017-11-17 MED ORDER — HYDRALAZINE HCL 20 MG/ML IJ SOLN: 2.0000 mg | Freq: Four times a day (QID) | INTRAMUSCULAR | Status: DC | PRN

## 2017-11-17 MED ORDER — SODIUM CHLORIDE 0.9 % IV SOLN
INTRAVENOUS | Status: DC
  Administered 2017-11-17: 20:00:00 via INTRAVENOUS

## 2017-11-17 NOTE — Progress Notes (Signed)
  Speech Language Pathology Treatment: Dysphagia  Patient Details Name: Aaron Rivera MRN: [id number] DOB: [birth year] Today's Date: 11/17/2017 Time: [id number] SLP Time Calculation (min) (ACUTE ONLY): 8 min  Assessment / Plan / Recommendation Clinical Impression  Patient seen for diagnostic treatment with focus on po readiness. Daughter and sisters present. Patient lethargic but does open eyes briefly with auditory and tactile stimulation provided. Note long (10-15 second) apneic episodes, RN made aware. Vocal quality severely wet (moaning) indicative of decreased secretions management with suspected aspiration of secretions without ability to follow commands for clearance. Discussed with family members present, explained rationale for continued NPO status. MD made aware of changes in respirations by RN. No pos provided given level of alertness and tenuous respiratory status.    HPI HPI: 83 y.o.malewith medical history significant forhypertension,systolic and dCHF,hyperlipidemia,AAA, andNIDDMwho presents on July 29, 2018after being found down by family for unknown period of time.  Dx acute ischemic large left MCA and right punctate cerebellarstrokes.  Palliative care has been consulted.       SLP Plan  Continue with current plan of care       Recommendations  Diet recommendations: NPO               Oral Care Recommendations: Oral care QID Follow up Recommendations: Other (comment)(tba) SLP Visit Diagnosis: Dysphagia, unspecified (R13.10) Plan: Continue with current plan of care       [facility] Louise[facility] San Lohmeyer MA, CCC-SLP [phone]             Jonas Goh Meryl 11/17/2017, 11:40 AM

## 2017-11-17 NOTE — Plan of Care (Signed)
Pt received Morphine for agitation and facial grimacing. Will continue to monitor pt for pain.

## 2017-11-17 NOTE — Care Management Note (Signed)
Case Management Note  Patient Details  Name: Aaron Rivera MRN: [id number] Date of Birth: 1930-02-29  Subjective/Objective:                    Action/Plan: PT recommendations are for [facility] services. CM met with the patient, his wife and step son and provided a list of [facility] agencies. They asked to take the list home and research the choices.  MD patient will need [facility] orders at D/C.  Contact number for the patient's wife if she is not in the room is: [phone] Remo Lipps). CM continuing to follow.   Expected Discharge Date:                  Expected Discharge Plan:  [place]  In-House Referral:     Discharge planning Services  CM Consult  Post Acute Care Choice:    Choice offered to:     DME Arranged:    DME Agency:     HH Arranged:    [place] Agency:     Status of Service:  In process, will continue to follow  If discussed at Long Length of Stay Meetings, dates discussed:    Additional Comments:  Pollie Friar, RN 11/17/2017, 2:34 PM

## 2017-11-17 NOTE — Evaluation (Signed)
Physical Therapy Evaluation Patient Details Name: Aaron Rivera Dib MRN: [id number] DOB: [birth year] Today's Date: 11/17/2017   History of Present Illness  Aaron Rivera Billing is a 83 y.o. male with medical history significant for hypertension, systolic and dCHF, hyperlipidemia, AAA, andNIDDM who presents on 2017-04-10 after being found down by family for unknown period of time.  Patient presents to the ED as a code stroke with left sided gaze preference and right sided weakness.  Patient's last known normal was the night prior to admission.  Large left MCA infarct and right cerebellar CVA.   Clinical Impression  Pt admitted with above diagnosis. Pt currently with functional limitations due to the deficits listed below (see PT Problem List). Pt was able to sit EOB x 10 min with min guard assist, better than expected. As he fatigued, needed max assist to sit EOB.  Transfer squat pivot to recliner with +2 mod assist with pad use as well.  Nurse aware that pt will need Maxi sky lift back to bed.  Will follow acutely and progress as able.   Pt will benefit from skilled PT to increase their independence and safety with mobility to allow discharge to the venue listed below.      Follow Up Recommendations SNF;Supervision/Assistance - 24 hour    Equipment Recommendations  None recommended by PT(TBA)    Recommendations for Other Services       Precautions / Restrictions Precautions Precautions: Fall Restrictions Weight Bearing Restrictions: No      Mobility  Bed Mobility Overal bed mobility: Needs Assistance Bed Mobility: Supine to Sit     Supine to sit: Max assist;Total assist;+2 for physical assistance     General bed mobility comments: Needed assist for LEs and for elevation of trunk  Transfers Overall transfer level: Needs assistance Equipment used: None Transfers: Sit to/from [employer]Stand;Squat Pivot Transfers Sit to Stand: Mod assist;Max assist;+2 physical assistance;From elevated  surface   Squat pivot transfers: Mod assist;Max assist;From elevated surface;+2 physical assistance     General transfer comment: Pt needed assist to squat at mod assist with pad to squat pivot to recliner.  Pt did have to help with transfer or PT could not have completed it but pt not was a heave mod to max assist transfer.   Ambulation/Gait                Stairs            Wheelchair Mobility    Modified Rankin (Stroke Patients Only)       Balance Overall balance assessment: Needs assistance Sitting-balance support: Bilateral upper extremity supported;Feet supported Sitting balance-Leahy Scale: Fair Sitting balance - Comments: Pt sits with flexed trunk slightly with ability to sit with min guard assist for up to 10 min and then needed mod to max assist with posterior lean as pt fatigued.  Pt with left gaze preference as well.   Postural control: Posterior lean Standing balance support: Bilateral upper extremity supported;During functional activity Standing balance-Leahy Scale: Zero Standing balance comment: unable to stand fully upright even with max assist.                              Pertinent Vitals/Pain Pain Assessment: No/denies pain  VSS  Home Living Family/patient expects to be discharged to:: Private residence Living Arrangements: Alone Available Help at Discharge: Family;Available 24 hours/day Type of Home: House Home Access: Stairs to enter Entrance Stairs-Rails: None Entrance Stairs-Number of  Steps: 2 Home Layout: One level Home Equipment: None      Prior Function Level of Independence: Independent         Comments: Pt fully independent and driving prior to this CVA     Hand Dominance        Extremity/Trunk Assessment   Upper Extremity Assessment Upper Extremity Assessment: Defer to OT evaluation    Lower Extremity Assessment Lower Extremity Assessment: RLE deficits/detail;LLE deficits/detail RLE Deficits /  Details: flexed at knee.  No active movement noted. LLE Deficits / Details: pt not following commands but noted 2-/5 movement    Cervical / Trunk Assessment Cervical / Trunk Assessment: Kyphotic  Communication   Communication: Expressive difficulties;Receptive difficulties  Cognition Arousal/Alertness: Lethargic Behavior During Therapy: Flat affect Overall Cognitive Status: Impaired/Different from baseline Area of Impairment: Orientation;Attention;Memory;Following commands;Safety/judgement;Awareness;Problem solving                 Orientation Level: Disoriented to;Place;Time;Situation     Following Commands: (did not follow any commands) Safety/Judgement: Decreased awareness of safety;Decreased awareness of deficits Awareness: (pre-intellectual) Problem Solving: Decreased initiation;Requires verbal cues;Requires tactile cues General Comments: limited assessment due to poor arousal of pt      General Comments      Exercises     Assessment/Plan    PT Assessment Patient needs continued PT services  PT Problem List Decreased activity tolerance;Decreased balance;Decreased strength;Decreased range of motion;Decreased mobility;Decreased coordination;Decreased cognition;Decreased knowledge of use of DME;Decreased safety awareness;Decreased knowledge of precautions       PT Treatment Interventions DME instruction;Gait training;Functional mobility training;Therapeutic activities;Therapeutic exercise;Balance training;Patient/family education;Cognitive remediation;Wheelchair mobility training    PT Goals (Current goals can be found in the Care Plan section)  Acute Rehab PT Goals Patient Stated Goal: to get better PT Goal Formulation: Patient unable to participate in goal setting Time For Goal Achievement: 12/01/17 Potential to Achieve Goals: Fair    Frequency Min 3X/week   Barriers to discharge        Co-evaluation               AM-PAC PT "6 Clicks" Daily  Activity  Outcome Measure Difficulty turning over in bed (including adjusting bedclothes, sheets and blankets)?: Unable Difficulty moving from lying on back to sitting on the side of the bed? : Unable Difficulty sitting down on and standing up from a chair with arms (e.g., wheelchair, bedside commode, etc,.)?: Unable Help needed moving to and from a bed to chair (including a wheelchair)?: Total Help needed walking in hospital room?: Total Help needed climbing 3-5 steps with a railing? : Total 6 Click Score: 6    End of Session Equipment Utilized During Treatment: Gait belt;Oxygen Activity Tolerance: Patient limited by fatigue Patient left: in chair;with call bell/phone within reach;with family/visitor present Nurse Communication: Mobility status;Need for lift equipment PT Visit Diagnosis: Unsteadiness on feet (R26.81);Muscle weakness (generalized) (M62.81);Hemiplegia and hemiparesis Hemiplegia - Right/Left: Right Hemiplegia - dominant/non-dominant: Dominant Hemiplegia - caused by: Cerebral infarction    Time: 1003-1030 PT Time Calculation (min) (ACUTE ONLY): 27 min   Charges:   PT Evaluation $PT Eval Moderate Complexity: 1 Mod PT Treatments $Therapeutic Activity: 8-22 mins   PT G Codes:        Mitchelle Goerner,PT Acute Rehabilitation [phone] (pager)   Berline Lopesawn F Jatavius Ellenwood 11/17/2017, 11:20 AM

## 2017-11-17 NOTE — Progress Notes (Signed)
Palliative Medicine RN Note: Rec'd a call requesting a follow up meeting. PMT NP Eduard RouxSarah Bullard has a meeting scheduled with the family tomorrow at 1300 per my conversation with Dinah.  Margret ChanceMelanie G. Cheryll Keisler, RN, BSN, [facility] 11/17/2017 10:24 AM Office [phone]

## 2017-11-17 NOTE — Progress Notes (Signed)
Hospitalist progress note         Sallye Oberhomas H Keathley  ZOX:[id number] DOB: 1930-01-01 DOA: 11/12/2017 PCP: Maple HudsonGilbert, Richard L Jr., MD   Specialists:   Brief Narrative:  6185 m htn, Systolic + dCHF, ckd iii-baseline creat ~ 1.87, hld, AAA, CAD s/p stent, reflux NIDDM-[facility]13[facility]-Large L MCA  Assessment & Plan:   Assessment:  The primary encounter diagnosis was Left middle cerebral artery stroke (HCC). Diagnoses of Encounter for feeding tube placement and Dysphagia were also pertinent to this visit.  Acute ischemic L MCA r Cerebellar CVA-has stenosis both ICA's-very high aspiration risk-Family electing to watch and see how patient does-holding for now PEG-cannot place Feeding tube-pallaitive medicne involved re: EOL discussions-cont rectal ASA Multifocal PNA-Rx Vanc and Zosyn-wean oxygen as tolerated--working hard to breathe-long discussion about feeding tubes and risk-benefit hyopokalemia-repalcing IV. K currently 4.4--holding VF with K 40 today H/o AAA-stable currently BPH-hytrin 2 mg on hold-has condom cath Htn-Permissive Htn explained to family-no oral meds-start hydraalzine for systolic >180 DM ty ii-cont IVF NS 75 cc/h for now-add dextrose as NPO Chr Sys HF-+825 cc.  Monitor fluid status closely CKD 2-3-baseline creat ~ 1.8 currently 2.5   DVT prophylaxis: lovenoex Code Status: DNAR Family Communication: long discussion at bedsode with 2 daughters-initally quesitons about Rx and no feeds, bp and aggressvie management-now after discussion realize Aspiration cannot be prevented--would hold feeding tube--will eliict further discussion from Pallaitive care team and will offer choice of fcility for Hospice if possible--family wants [facility]-I have explained we can try but subject ot avilability. Disposition Plan: unclear at this time   Consultants:   Neuro  Pallliative care  Procedures:     Antimicrobials:    vanc and Zosyn   Subjective:  Non-verbal  Grunts Breathing ~ 40 x per min-raises L arm   Objective: Vitals:   11/16/17 2300 11/17/17 0300 11/17/17 0400 11/17/17 0732  BP: (!) 136/124 (!) 146/91 (!) 151/91   Pulse: 88     Resp: (!) 33 (!) 40 (!) 31   Temp: 97.8 F (36.6 C) 97.8 F (36.6 C)  98.5 F (36.9 C)  TempSrc: Oral Oral  Axillary  SpO2: 92%  94%   Weight:      Height:        Intake/Output Summary (Last 24 hours) at 11/17/2017 0757 Last data filed at 11/17/2017 0400 Gross per 24 hour  Intake 2150 ml  Output 1150 ml  Net 1000 ml   Filed Weights   11/03/2017 1201 10/31/2017 1701  Weight: 95.5 kg (210 lb 8.6 oz) 92.9 kg (204 lb 12.9 oz)    Examination: Awake some facial twisting, coarse llung sounds s1 s2 tachy slight Adventitious sounds in lower lungs abd soft nt nd no rebound No le edema Reflexes brisk in knees  Data Reviewed: I have personally reviewed following labs and imaging studies  CBC: Recent Labs  Lab 10/29/2017 1151 10/23/2017 1202  WBC 6.4  --   NEUTROABS 4.0  --   HGB 10.8* 11.6*  HCT 33.8* 34.0*  MCV 93.1  --   PLT 101*  --    Basic Metabolic Panel: Recent Labs  Lab 11/20/2017 1151 10/24/2017 1202 11/15/17 0742 11/16/17 0956 11/17/17 0234  NA 143 147* 144  --  148*  K 3.5 3.5 3.3*  --  4.4  CL 108 110 109  --  115*  CO2 23  --  22  --  20*  GLUCOSE 188* 188* 186*  --  162*  BUN 32* 31* 27*  --  22*  CREATININE 2.66* 2.60* 2.50*  --  2.41*  CALCIUM 8.7*  --  8.5*  --  8.2*  MG  --   --   --  1.7  --    GFR: Estimated Creatinine Clearance: 25.7 mL/min (A) (by C-G formula based on SCr of 2.41 mg/dL (H)). Liver Function Tests: Recent Labs  Lab 01-24-2017 1151  AST 108*  ALT 152*  ALKPHOS 112  BILITOT 1.3*  PROT 7.4  ALBUMIN 3.0*   No results for input(s): LIPASE, AMYLASE in the last 168 hours. No results for input(s): AMMONIA in the last 168 hours. Coagulation Profile: Recent Labs  Lab 01-24-2017 1151  INR 1.49    Cardiac Enzymes: No results for input(s): CKTOTAL, CKMB, CKMBINDEX, TROPONINI in the last 168 hours. CBG: Recent Labs  Lab 01-24-2017 1153  GLUCAP 173*   Urine analysis: No results found for: COLORURINE, APPEARANCEUR, LABSPEC, PHURINE, GLUCOSEU, HGBUR, BILIRUBINUR, KETONESUR, PROTEINUR, UROBILINOGEN, NITRITE, LEUKOCYTESUR   Radiology Studies: Reviewed images personally in health database    Scheduled Meds: . aspirin  300 mg Rectal Daily   Or  . aspirin  325 mg Oral Daily  . chlorhexidine  15 mL Mouth Rinse BID  . Chlorhexidine Gluconate Cloth  6 each Topical Q0600  . glycopyrrolate  0.1 mg Intravenous TID  . heparin  5,000 Units Subcutaneous Q8H  . mouth rinse  15 mL Mouth Rinse q12n4p  . mupirocin ointment  1 application Nasal BID  . rosuvastatin  20 mg Oral q1800   Continuous Infusions: . 0.9 % NaCl with KCl 40 mEq / L 75 mL/hr (11/17/17 0600)  . piperacillin-tazobactam (ZOSYN)  IV Stopped (11/17/17 16100613)  . vancomycin 750 mg (11/17/17 0606)     LOS: 3 days    Time spent: 4135    Pleas KochJai Dereon Williamsen, MD [facility]) [phone]   If 7PM-7AM, please contact night-coverage [email or web address] Password [email or web address] 11/17/2017, 7:57 AM

## 2017-11-18 ENCOUNTER — Inpatient Hospital Stay: Payer: Self-pay | Admitting: Family Medicine

## 2017-11-18 ENCOUNTER — Inpatient Hospital Stay: Payer: Medicare HMO | Admitting: Family Medicine

## 2017-11-18 DIAGNOSIS — Z515 Encounter for palliative care: Secondary | ICD-10-CM

## 2017-11-18 MED ORDER — MORPHINE SULFATE (PF) 4 MG/ML IV SOLN
2.0000 mg | INTRAVENOUS | Status: DC | PRN
  Administered 2017-11-18 – 2017-11-19 (×7): 2 mg via INTRAVENOUS
  Filled 2017-11-18 (×7): qty 1

## 2017-11-18 MED ORDER — LORAZEPAM 2 MG/ML IJ SOLN
1.0000 mg | Freq: Once | INTRAMUSCULAR | Status: AC
  Administered 2017-11-18: 1 mg via INTRAVENOUS
  Filled 2017-11-18: qty 1

## 2017-11-18 MED ORDER — GLYCOPYRROLATE 0.2 MG/ML IJ SOLN
0.4000 mg | Freq: Three times a day (TID) | INTRAMUSCULAR | Status: DC
  Administered 2017-11-18 – 2017-11-19 (×2): 0.4 mg via INTRAVENOUS
  Filled 2017-11-18 (×2): qty 2

## 2017-11-18 MED ORDER — GLYCOPYRROLATE 0.2 MG/ML IJ SOLN: 0.2000 mg | Freq: Four times a day (QID) | INTRAMUSCULAR | Status: DC | PRN

## 2017-11-18 NOTE — Progress Notes (Signed)
SLP Cancellation Note  Patient Details Name: Aaron Rivera MRN: [id number] DOB: [birth year]   Cancelled treatment:       Reason Eval/Treat Not Completed: SLP screened, no needs identified, will sign off. [facility] from palliative [facility] SLP will d/c orders; please reconsult if needed.  Rondel BatonMary Beth Keairra Bardon, TennesseeMS, CCC-SLP Speech-Language Pathologist [phone]     Aaron LindauMary E Smith Rivera 11/18/2017, 4:23 PM

## 2017-11-18 NOTE — Progress Notes (Signed)
Daily Progress Note   Patient Name: Aaron Rivera       Date: 11/18/2017 DOB: Feb 23, 1930  Age: 83 y.o. MRN#: [id number] Attending Physician: Nita Sells, MD Primary Care Physician: Jerrol Banana., MD Admit Date: 10/31/2017  Reason for Consultation/Follow-up: Establishing goals of care, Inpatient hospice referral, Non pain symptom management, Pain control and Psychosocial/spiritual support  Subjective: Met with patient, 9 family members, chart reviewed.  Patient is unable to participate in assessment.  He is visibly short of breath at rest with upper airway secretions  Length of Stay: 4  Current Medications: Scheduled Meds:  . aspirin  300 mg Rectal Daily   Or  . aspirin  325 mg Oral Daily  . chlorhexidine  15 mL Mouth Rinse BID  . Chlorhexidine Gluconate Cloth  6 each Topical Q0600  . glycopyrrolate  0.4 mg Intravenous Q8H  . heparin  5,000 Units Subcutaneous Q8H  . mouth rinse  15 mL Mouth Rinse q12n4p  . mupirocin ointment  1 application Nasal BID    Continuous Infusions: . sodium chloride 75 mL/hr at 11/17/17 2011  . piperacillin-tazobactam (ZOSYN)  IV Stopped (11/18/17 1342)    PRN Meds: acetaminophen **OR** acetaminophen (TYLENOL) oral liquid 160 mg/5 mL **OR** acetaminophen, glycopyrrolate, hydrALAZINE, morphine injection, white petrolatum  Physical Exam  Constitutional: He appears well-developed and well-nourished.  Acutely ill appearing elderly man; increased work of breathing at rest; moderate distress  HENT:  Head: Normocephalic and atraumatic.  Cardiovascular:  Tachycardic  Pulmonary/Chest:  Increased work of breathing Upper airway secretions  Abdominal: Soft.  Genitourinary:  Genitourinary Comments: Foley catheter  Musculoskeletal:    Has been observed to move his right upper extremity some  Neurological:  Minimally responsive  Skin: Skin is warm and dry.  Psychiatric:  Unable to test  Nursing note and vitals reviewed.           Vital Signs: BP (!) 143/84 (BP Location: Left Arm)   Pulse 95   Temp (!) 97.3 F (36.3 C) (Axillary)   Resp (!) 96   Ht 5' 10"  (1.778 m)   Wt 93 kg (205 lb 0.4 oz)   SpO2 96%   BMI 29.42 kg/m  SpO2: SpO2: 96 % O2 Device: O2 Device: Nasal Cannula O2 Flow Rate: O2 Flow Rate (L/min): 2 L/min  Intake/output summary:  Intake/Output Summary (Last 24 hours) at 11/18/2017 1537 Last data filed at 11/18/2017 0800 Gross per 24 hour  Intake 1161.25 ml  Output 975 ml  Net 186.25 ml   LBM: Last BM Date: (unknown) Baseline Weight: Weight: 95.5 kg (210 lb 8.6 oz) Most recent weight: Weight: 93 kg (205 lb 0.4 oz)       Palliative Assessment/Data:    Flowsheet Rows     Most Recent Value  Intake Tab  Referral Department  Hospitalist  Unit at Time of Referral  Med/Surg Unit  Palliative Care Primary Diagnosis  Cardiac  Date Notified  11/10/2017  Palliative Care Type  New Palliative care  Reason for referral  Clarify Goals of Care, Psychosocial or Spiritual support  Date of Admission  10/22/2017  Date first seen by Palliative Care  11/15/17  # of days Palliative referral response time  1 Day(s)  # of days IP prior to Palliative referral  0  Clinical Assessment  Psychosocial & Spiritual Assessment  Palliative Care Outcomes      Patient Active Problem List   Diagnosis Date Noted  . Other dysphagia   . Left middle cerebral artery stroke ([place])   . Palliative care by specialist   . DNR (do not resuscitate)   . Acute ischemic left MCA stroke ([place]) 10/21/2017  . Chronic systolic CHF (congestive heart failure) ([place]) 11/15/2017  . Multifocal pneumonia 11/12/2017  . Acute CHF (congestive heart failure) ([place]) 11/02/2017  . Vitamin D deficiency, unspecified 08/26/2017  . PAD (peripheral  artery disease) ([place]) 03/20/2017  . Carotid stenosis 03/20/2017  . AAA (abdominal aortic aneurysm) ([place]) 06/16/2015  . Allergic rhinitis 06/16/2015  . AI (aortic incompetence) 06/16/2015  . Benign prostatic hyperplasia with lower urinary tract symptoms 06/16/2015  . Coronary artery abnormality 06/16/2015  . ED (erectile dysfunction) of organic origin 06/16/2015  . Essential (primary) hypertension 06/16/2015  . Acid reflux 06/16/2015  . HLD (hyperlipidemia) 06/16/2015  . Arthritis, degenerative 06/16/2015  . Allergic rhinitis, seasonal 06/16/2015  . Diabetes mellitus, type 2 ([place]) 06/16/2015    Palliative Care Assessment & Plan   Patient Profile: 83 y.o. male   admitted on 10/28/2017 with a past medical history significant forhypertension, CAD,hyperlipidemia,AAA, and diabeteswho was admittedafter being found down by family for unknown period of time. A  code stroke is initiated    Patient's baseline was fully independent, living alone and driving.  Of note patient was recently hospitalized for new onset CHFand associated demand ischemia.   CT head: Emergent large vessel occlusion of left ICA left MCA territory.   CTneck: 90% high-grade stenosis of right ICA, 75-90% stenosis of proximal left ICA.  CT perfusion: Large core MCA territory infarct, moderate penumbra surrounding cord area of ischemia.  Consult ordered for goals of care  Assessment: Met with patient's daughters, sisters, granddaughter as well as niece and son-in-law to review again goals of care, artificial feeding and hydration at end of life risk and benefits, patient's current clinical status.  Family has been struggling with this acute change.  Patient had been independent prior to having this large MCA territory stroke.  Some family members share, that they have seen him be able to sit on the edge of the bed with physical therapy and some movement to his right upper extremity, nodding his head yes and no;  they have been struggling with knowing that he would not wish to be in this debilitated state and wanting to give him every opportunity to get better.  As  multiple family members expressed their opinion today, they did all ultimately agree that even if patient regained some function he would not return to his prior level of independence which all family members verbalized were of utmost importance to him.  Once this was verbalized they were able to move forward with the decision to go to residential hospice.  I did share with them that hospice provided comfort, dignified care but they are unable to provide IV fluids, blood transfusions, IV antibiotics, and they verbalized understanding but are requesting that those interventions stay in place until he is actually discharged; "we want to know that we have done everything we can for as long as we could "  They also agreed to transfer to a larger unit in the hospital for ease of family visiting to support patient.  Recommendations/Plan:   Consult placed to social work for residential hospice  Notified [facility] representative on 11/18/2017 of request; report given  There is no bed availability today at [facility].  I offered choice of other facilities but most of patient's family live within 5 minutes of [facility] and they deferred at this time  Continue telemetry until discharge  Continue IV fluids but will KVO rate; continue IV antibiotics until discharge; continue heparin until discharge to residential hospice  We will increase scheduled Robinul as patient secretions are worsening; KVO fluids  Family expressing concerns about morphine.  Did provide opioid education and they agreed to continue but on an as-needed basis.  Will continue as needed but offer it every 2 hours as needed for both pain as well as respiratory distress  Transfer to 6 [address]. and continue telemetry  Goals of Care and Additional  Recommendations:  Limitations on Scope of Treatment: Avoid Hospitalization, Minimize Medications, No Artificial Feeding, No Blood Transfusions, No Chemotherapy, No Diagnostics, No Hemodialysis, No Radiation, No Surgical Procedures and No Tracheostomy  Code Status:    Code Status Orders  (From admission, onward)        Start     Ordered   11/15/17 1509  Do not attempt resuscitation (DNR)  Continuous    Question Answer Comment  In the event of cardiac or respiratory ARREST Do not call a "code blue"   In the event of cardiac or respiratory ARREST Do not perform Intubation, CPR, defibrillation or ACLS   In the event of cardiac or respiratory ARREST Use medication by any route, position, wound care, and other measures to relive pain and suffering. May use oxygen, suction and manual treatment of airway obstruction as needed for comfort.      11/15/17 1508    Code Status History    Date Active Date Inactive Code Status Order ID Comments User Context   10/24/2017 14:32 11/15/2017 15:08 Full Code [id number]  Desiree Hane, MD ED   11/02/2017 14:09 11/04/2017 17:12 Full Code [id number]  Demetrios Loll, MD Inpatient       Prognosis:   Hours - Days in the setting of large MCA territory stroke, unable to control upper airway secretions unable to take anything by mouth, worsening shortness of breath  Discharge Planning:  Hospice facility  Care plan was discussed with Dr. Verlon Au and bedside RN  Thank you for allowing the Palliative Medicine Team to assist in the care of this patient.   Time In: 1300 Time Out: 1430 Total Time 90 min Prolonged Time Billed  yes       Greater than 50%  of this  time was spent counseling and coordinating care related to the above assessment and plan.  Dory Horn, NP  Please contact Palliative Medicine Team phone at [phone] for questions and concerns.

## 2017-11-18 NOTE — Progress Notes (Signed)
Pharmacy Antibiotic Note  Aaron Rivera is a 83 y.o. male admitted on September 11, 2017 with CVA and PNA.   Day # 5 of broad spectrum antibiotics Scr = 2.41 Afebrile, WBC WNL No cultures  Plan: Vancomycin 750 mg IV q48h Zosyn 3.375 g IV q8h   Consider streamlining - > Unasyn?  Height: 5\' 10"  (177.8 cm) Weight: 205 lb 0.4 oz (93 kg) IBW/kg (Calculated) : 73  Temp (24hrs), Avg:97.9 F (36.6 C), Min:97.3 F (36.3 C), Max:98.7 F (37.1 C)  Recent Labs  Lab Aug 17, 2017 1151 Aug 17, 2017 1202 11/15/17 0742 11/17/17 0234  WBC 6.4  --   --   --   CREATININE 2.66* 2.60* 2.50* 2.41*    Estimated Creatinine Clearance: 25.7 mL/min (A) (by C-G formula based on SCr of 2.41 mg/dL (H)).    No Known Allergies  Thank you Okey RegalLisa Gricelda Foland, PharmD [phone]  11/18/2017 12:49 PM

## 2017-11-18 NOTE — Progress Notes (Signed)
[facility]  Received request from CSW BergooNadia [facility] followed by call from PMT NP Eduard RouxSarah Bullard. Both aware no [facility] availability at the moment and I will follow up with family tomorrow. Attempted contact with family this afternoon. Did not get answer at any number tried. Left message and contact information. [facility].   Thank you for this referral.   Forrestine Himva Davis, LCSW [phone]

## 2017-11-18 NOTE — Progress Notes (Signed)
OT Cancellation Note  Patient Details Name: Aaron Rivera MRN: [id number] DOB: 11-11-1930   Cancelled Treatment:    Reason Eval/Treat Not Completed: OT screened, no needs identified, will sign off. Note from LCSWA [facility]. OT will discontinue orders. Should anything change, please re-order.   Evern BioLaura J Rashiya Lofland 11/18/2017, 3:22 PM  Sherryl MangesLaura Eren Puebla OTR/L [phone]

## 2017-11-18 NOTE — Progress Notes (Signed)
Hospitalist progress note         Aaron Rivera  ZOX:[id number] DOB: 1929/12/11 DOA: 11/05/2017 PCP: Maple HudsonGilbert, Richard L Jr., MD   Specialists:   Brief Narrative:  [age] m htn, Systolic + dCHF, ckd iii-baseline creat ~ 1.87, hld, AAA, CAD s/p stent, reflux NIDDM-[facility]-Large L MCA  Assessment & Plan:   Assessment:  The primary encounter diagnosis was Left middle cerebral artery stroke (HCC). Diagnoses of Encounter for feeding tube placement and Dysphagia were also pertinent to this visit.  Acute ischemic L MCA r Cerebellar CVA-has stenosis both ICA's-very high aspiration risk-GOC per pallaitive medicne full comfort-await Residential Hopsice bed Multifocal PNA-Rx Vanc and Zosyn-wean oxygen as tolerated--tachypneic-cont treating Infection till d/c to hopsice facility hyopokalemia-repalcing IV. K currently 4.4--holding VF with K 40 today--no furthe rlab draw H/o AAA-stable currently BPH-hytrin 2 mg on hold-has condom cath Htn-Permissive Htn explained to family-no oral meds-start hydraalzine for systolic >180--would not aggressively manage beyond this DM ty ii-cont IVF NS 75 cc/h till d/c Chr Sys HF-+825 cc. 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 Disposition Plan: freestanding Hospice   Consultants:   Neuro  Pallliative care  Procedures:     Antimicrobials:   vanc and Zosyn   Subjective:  Non-verbal but a little more rousable breathign heavy Cannot orient   Objective: Vitals:   11/18/17 0800 11/18/17 0801 11/18/17 0825 11/18/17 1100  BP:   (!) 143/84   Pulse: 97  95   Resp: (!) 36  (!) 96   Temp:  (!) 97.3 F (36.3 C)  (!) 97.3 F (36.3 C)  TempSrc:  Axillary  Axillary  SpO2: 97%  96%   Weight:      Height:        Intake/Output Summary (Last 24 hours) at  11/18/2017 1542 Last data filed at 11/18/2017 0800 Gross per 24 hour  Intake 1161.25 ml  Output 975 ml  Net 186.25 ml   Filed Weights   10/30/2017 1201 11/07/2017 1701 11/18/17 0340  Weight: 95.5 kg (210 lb 8.6 oz) 92.9 kg (204 lb 12.9 oz) 93 kg (205 lb 0.4 oz)    Examination: Awake  s1 s2 tachy slight Mild rales abd soft nt nd no rebound No le edema Reflexes brisk in knees  Data Reviewed: I have personally reviewed following labs and imaging studies  CBC: Recent Labs  Lab 11/02/2017 1151 11/20/2017 1202  WBC 6.4  --   NEUTROABS 4.0  --   HGB 10.8* 11.6*  HCT 33.8* 34.0*  MCV 93.1  --   PLT 101*  --    Basic Metabolic Panel: Recent Labs  Lab 11/12/2017 1151 11/10/2017 1202 11/15/17 0742 11/16/17 0956 11/17/17 0234  NA 143 147* 144  --  148*  K 3.5 3.5 3.3*  --  4.4  CL 108 110 109  --  115*  CO2 23  --  22  --  20*  GLUCOSE 188* 188* 186*  --  162*  BUN 32* 31* 27*  --  22*  CREATININE 2.66* 2.60* 2.50*  --  2.41*  CALCIUM 8.7*  --  8.5*  --  8.2*  MG  --   --   --  1.7  --    GFR: Estimated Creatinine Clearance: 25.7 mL/min (A) (by C-G formula based on SCr of 2.41 mg/dL (  H)). Liver Function Tests: Recent Labs  Lab 2017/09/30 1151  AST 108*  ALT 152*  ALKPHOS 112  BILITOT 1.3*  PROT 7.4  ALBUMIN 3.0*   No results for input(s): LIPASE, AMYLASE in the last 168 hours. No results for input(s): AMMONIA in the last 168 hours. Coagulation Profile: Recent Labs  Lab 2017/09/30 1151  INR 1.49   Cardiac Enzymes: No results for input(s): CKTOTAL, CKMB, CKMBINDEX, TROPONINI in the last 168 hours. CBG: Recent Labs  Lab 2017/09/30 1153  GLUCAP 173*   Urine analysis: No results found for: COLORURINE, APPEARANCEUR, LABSPEC, PHURINE, GLUCOSEU, HGBUR, BILIRUBINUR, KETONESUR, PROTEINUR, UROBILINOGEN, NITRITE, LEUKOCYTESUR   Radiology Studies: Reviewed images personally in health database    Scheduled Meds: . aspirin  300 mg Rectal Daily   Or  . aspirin  325 mg  Oral Daily  . chlorhexidine  15 mL Mouth Rinse BID  . Chlorhexidine Gluconate Cloth  6 each Topical Q0600  . glycopyrrolate  0.4 mg Intravenous Q8H  . heparin  5,000 Units Subcutaneous Q8H  . mouth rinse  15 mL Mouth Rinse q12n4p  . mupirocin ointment  1 application Nasal BID   Continuous Infusions: . sodium chloride 75 mL/hr at 11/17/17 2011  . piperacillin-tazobactam (ZOSYN)  IV Stopped (11/18/17 1342)     LOS: 4 days    Time spent: 515    Pleas KochJai Anjelo Pullman, MD [facility](P) [phone]   If 7PM-7AM, please contact night-coverage [email or web address] Password [email or web address] 11/18/2017, 3:42 PM

## 2017-11-18 NOTE — Progress Notes (Signed)
OT Cancellation Note  Patient Details Name: Aaron Rivera MRN: [id number] DOB: [birth year]   Cancelled Treatment:    Reason Eval/Treat Not Completed: Other (comment)(Palliative Meeting scheduled for 1300 today, will hold OT until after meeting to best meet the needs/goals of Pt and family)  Emelda FearLaura J Eriyana Sweeten 11/18/2017, 10:52 AM  Sherryl MangesLaura Junko [place]agan OTR/L [phone]

## 2017-11-18 NOTE — Progress Notes (Signed)
CSW received consult regarding residential hospice placement. [facility] They do not have any beds available today. CSW to continue to reach out to facilities.   Osborne Cascoadia Latika Kronick LCSWA [phone]

## 2017-11-19 MED ORDER — SODIUM CHLORIDE 0.9 % IV SOLN
1.0000 mg/h | INTRAVENOUS | Status: DC
  Administered 2017-11-19: 1 mg/h via INTRAVENOUS
  Filled 2017-11-19: qty 10

## 2017-11-19 MED ORDER — MORPHINE BOLUS VIA INFUSION
2.0000 mg | INTRAVENOUS | Status: DC | PRN
  Administered 2017-11-19 – 2017-11-20 (×10): 2 mg via INTRAVENOUS
  Filled 2017-11-19: qty 2

## 2017-11-19 MED ORDER — MORPHINE SULFATE (PF) 4 MG/ML IV SOLN: 2.0000 mg | INTRAVENOUS | Status: DC

## 2017-11-19 MED ORDER — LORAZEPAM 2 MG/ML IJ SOLN
0.5000 mg | INTRAMUSCULAR | Status: DC | PRN
  Administered 2017-11-19 – 2017-11-20 (×3): 1 mg via INTRAVENOUS
  Filled 2017-11-19 (×3): qty 1

## 2017-11-19 MED ORDER — MORPHINE SULFATE (PF) 4 MG/ML IV SOLN: 2.0000 mg | INTRAVENOUS | Status: DC | PRN

## 2017-11-19 MED ORDER — GLYCOPYRROLATE 0.2 MG/ML IJ SOLN
0.4000 mg | INTRAMUSCULAR | Status: DC
  Administered 2017-11-19 – 2017-11-21 (×12): 0.4 mg via INTRAVENOUS
  Filled 2017-11-19 (×12): qty 2

## 2017-11-19 NOTE — Progress Notes (Signed)
Hospitalist progress note         Aaron Rivera  ZOX:[id number] DOB: 11/27/1929 DOA: 2017-03-18 PCP: Maple HudsonGilbert, Richard L Jr., MD   Specialists:   Brief Narrative:  [age] m htn, Systolic + dCHF, ckd iii-baseline creat ~ 1.87, hld, AAA, CAD s/p stent, reflux NIDDM-recent [facility]13[facility]-Large L MCA  Assessment & Plan:   Assessment:  The primary encounter diagnosis was Left middle cerebral artery stroke (HCC). Diagnoses of Encounter for feeding tube placement and Dysphagia were also pertinent to this visit.  Acute ischemic L MCA r Cerebellar CVA-has stenosis both ICA's-very high aspiration risk-GOC per pallaitive medicne full comfort-seems to be progressing so meds have been increased for comofrt-might not survive beyond next 48 hours--re-evaluate am for residential placement but if progressing allow Hospital death Multifocal PNA-Rx Vanc and Zosyn-wean oxygen as tolerated--tachypneic-abx d/c by Pallaitve care hyopokalemia-repalcing IV. K currently 4.4--holding VF with K 40 today--no further lab draw H/o AAA-stable currently BPH-hytrin 2 mg on hold-has condom cath Htn-Permissive Htn explained to family-no oral meds-start hydraalzine for systolic >180--would not aggressively manage beyond this DM ty ii-cont IVF NS 75 cc/h till d/c Chr Sys HF-+825 cc. 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 Disposition Plan: freestanding Hospice   Consultants:   Neuro  Pallliative care  Procedures:     Antimicrobials:   vanc and Zosyn   Subjective:  less rousable Some periodic apneas  Objective: Vitals:   11/18/17 1100 11/18/17 1700 11/19/17 0336 11/19/17 0628  BP:  (!) 150/83  127/69  Pulse:  95  95  Resp:  (!) 22  20  Temp: (!) 97.3 F (36.3 C) 97.7 F (36.5 C)  98.1 F (36.7 C)  TempSrc: Axillary  Axillary  Oral  SpO2:  95%  98%  Weight:   93.9 kg (207 lb 0.2 oz)   Height:        Intake/Output Summary (Last 24 hours) at 11/19/2017 1442 Last data filed at 11/19/2017 1300 Gross per 24 hour  Intake 240 ml  Output 800 ml  Net -560 ml   Filed Weights   09/18/17 1701 11/18/17 0340 11/19/17 0336  Weight: 92.9 kg (204 lb 12.9 oz) 93 kg (205 lb 0.4 oz) 93.9 kg (207 lb 0.2 oz)    Examination:  sleepy s1 s2 tachy slight Decreased AE abd soft nt nd no rebound No le edema   Data Reviewed: I have personally reviewed following labs and imaging studies  CBC: Recent Labs  Lab 09/18/17 1151 09/18/17 1202  WBC 6.4  --   NEUTROABS 4.0  --   HGB 10.8* 11.6*  HCT 33.8* 34.0*  MCV 93.1  --   PLT 101*  --    Basic Metabolic Panel: Recent Labs  Lab 09/18/17 1151 09/18/17 1202 11/15/17 0742 11/16/17 0956 11/17/17 0234  NA 143 147* 144  --  148*  K 3.5 3.5 3.3*  --  4.4  CL 108 110 109  --  115*  CO2 23  --  22  --  20*  GLUCOSE 188* 188* 186*  --  162*  BUN 32* 31* 27*  --  22*  CREATININE 2.66* 2.60* 2.50*  --  2.41*  CALCIUM 8.7*  --  8.5*  --  8.2*  MG  --   --   --  1.7  --  GFR: Estimated Creatinine Clearance: 25.8 mL/min (A) (by C-G formula based on SCr of 2.41 mg/dL (H)). Liver Function Tests: Recent Labs  Lab 11/13/2017 1151  AST 108*  ALT 152*  ALKPHOS 112  BILITOT 1.3*  PROT 7.4  ALBUMIN 3.0*   No results for input(s): LIPASE, AMYLASE in the last 168 hours. No results for input(s): AMMONIA in the last 168 hours. Coagulation Profile: Recent Labs  Lab 11/16/2017 1151  INR 1.49   Cardiac Enzymes: No results for input(s): CKTOTAL, CKMB, CKMBINDEX, TROPONINI in the last 168 hours. CBG: Recent Labs  Lab 11/03/2017 1153  GLUCAP 173*   Urine analysis: No results found for: COLORURINE, APPEARANCEUR, LABSPEC, PHURINE, GLUCOSEU, HGBUR, BILIRUBINUR, KETONESUR, PROTEINUR, UROBILINOGEN, NITRITE, LEUKOCYTESUR   Radiology Studies: Reviewed images  personally in health database    Scheduled Meds: . glycopyrrolate  0.4 mg Intravenous Q4H  . mouth rinse  15 mL Mouth Rinse q12n4p   Continuous Infusions: . sodium chloride 75 mL/hr at 11/17/17 2011  . morphine 1 mg/hr (11/19/17 0904)     LOS: 5 days    Time spent: 1115    Pleas KochJai Rayyan Orsborn, MD [facility](P) [phone]   If 7PM-7AM, please contact night-coverage [email or web address] Password [email or web address] 11/19/2017, 2:42 PM

## 2017-11-19 NOTE — Progress Notes (Signed)
Daily Progress Note   Patient Name: Aaron Rivera       Date: 11/19/2017 DOB: February 28, 1930  Age: 83 y.o. MRN#: [id number] Attending Physician: Rhetta Mura, MD Primary Care Physician: Maple Hudson., MD Admit Date: 10-Dec-2017  Reason for Consultation/Follow-up: Establishing goals of care, Non pain symptom management, Pain control and Psychosocial/spiritual support  Subjective: Pt has declined overnight. Now transitioning.No family at the bedside. Called daughter Aaron Rivera who family has indicated as point of contact to update her and to discuss changes to plan of care. No bed at [facility]. Pt likely too unstable to transport at this point  Length of Stay: 5  Current Medications: Scheduled Meds:  . aspirin  300 mg Rectal Daily   Or  . aspirin  325 mg Oral Daily  . chlorhexidine  15 mL Mouth Rinse BID  . glycopyrrolate  0.4 mg Intravenous Q4H  . mouth rinse  15 mL Mouth Rinse q12n4p  . mupirocin ointment  1 application Nasal BID    Continuous Infusions: . sodium chloride 75 mL/hr at 11/17/17 2011  . morphine      PRN Meds: acetaminophen **OR** acetaminophen (TYLENOL) oral liquid 160 mg/5 mL **OR** acetaminophen, glycopyrrolate, hydrALAZINE, LORazepam, morphine, white petrolatum  Physical Exam  Constitutional:  Pt transitioning; minimally responsive now  Pulmonary/Chest:  cheynes stokes resp pattern Brief frequent apnea, 10-15 sec Copious secretions  Neurological:  minimally resposnive  Skin: Skin is warm and dry.  Faint mottling  Nursing note and vitals reviewed.           Vital Signs: BP 127/69 (BP Location: Left Arm)   Pulse 95   Temp 98.1 F (36.7 C) (Oral)   Resp 20   Ht 5\' 10"  (1.778 m)   Wt 93.9 kg (207 lb 0.2 oz)   SpO2 98%   BMI 29.70  kg/m  SpO2: SpO2: 98 % O2 Device: O2 Device: Nasal Cannula O2 Flow Rate: O2 Flow Rate (L/min): 4 L/min  Intake/output summary:   Intake/Output Summary (Last 24 hours) at 11/19/2017 0826 Last data filed at 11/19/2017 0630 Gross per 24 hour  Intake 240 ml  Output 800 ml  Net -560 ml   LBM: Last BM Date: (unknown) Baseline Weight: Weight: 95.5 kg (210 lb 8.6 oz) Most recent weight: Weight: 93.9 kg (207 lb 0.2 oz)  Palliative Assessment/Data:    Flowsheet Rows     Most Recent Value  Intake Tab  Referral Department  Hospitalist  Unit at Time of Referral  Med/Surg Unit  Palliative Care Primary Diagnosis  Cardiac  Date Notified  12/02/2016  Palliative Care Type  New Palliative care  Reason for referral  Clarify Goals of Care, Psychosocial or Spiritual support  Date of Admission  12/02/2016  Date first seen by Palliative Care  11/15/17  # of days Palliative referral response time  1 Day(s)  # of days IP prior to Palliative referral  0  Clinical Assessment  Psychosocial & Spiritual Assessment  Palliative Care Outcomes      Patient Active Problem List   Diagnosis Date Noted  . Dysphagia   . Left middle cerebral artery stroke (HCC)   . Palliative care by specialist   . DNR (do not resuscitate)   . Acute ischemic left MCA stroke (HCC) 26-Jun-2017  . Chronic systolic CHF (congestive heart failure) (HCC) 26-Jun-2017  . Multifocal pneumonia 26-Jun-2017  . Acute CHF (congestive heart failure) (HCC) 11/02/2017  . Vitamin D deficiency, unspecified 08/26/2017  . PAD (peripheral artery disease) (HCC) 03/20/2017  . Carotid stenosis 03/20/2017  . AAA (abdominal aortic aneurysm) (HCC) 06/16/2015  . Allergic rhinitis 06/16/2015  . AI (aortic incompetence) 06/16/2015  . Benign prostatic hyperplasia with lower urinary tract symptoms 06/16/2015  . Coronary artery abnormality 06/16/2015  . ED (erectile dysfunction) of organic origin 06/16/2015  . Essential (primary) hypertension  06/16/2015  . Acid reflux 06/16/2015  . HLD (hyperlipidemia) 06/16/2015  . Arthritis, degenerative 06/16/2015  . Allergic rhinitis, seasonal 06/16/2015  . Diabetes mellitus, type 2 (HCC) 06/16/2015    Palliative Care Assessment & Plan   Patient Profile: 83 y.o.maleadmitted on 06-Aug-2018with a pastmedical history significant forhypertension, CAD,hyperlipidemia,AAA, and diabeteswhowas admittedafter being found down by family for unknown period of time.Acode stroke is initiated  Patient's baseline was fully independent, living alone and driving.  Of note patient was recently hospitalized for new onset CHFand associated demand ischemia.   CT head: Emergent large vessel occlusion of left ICA left MCA territory.   CTneck: 90% high-grade stenosis of right ICA, 75-90% stenosis of proximal left ICA.  CT perfusion: Large core MCA territory infarct, moderate penumbra surrounding cord area of ischemia.  Consult ordered for goals of care Pt now transitioning towards EOL and is now comfort care   Assessment: Copious secretions despite sch robinul. Cheynes stokes respiratory pattern with frequent apnea. Pt has been receiving frequent PRN MS04 thru out the night for resp distress as well as assoc anxiety present. Spoke to daughter Aaron AmberDianh and below plan was deiscussed with her and she concurred. I did share with her that I feel if things continue as they are niw, pt could have only hours to days. She verbalized she was sur[prised he made it through the night  Recommendations/Plan:  DC abx, heparin. Daughter aware and agreed  Pt too unstable to transport. Family in agreement  Will up titrate robinul for copious secretions to 0.4mg  IV q4 ATC and q6 PRN  Start MS04 gtt 1mg /hr and 2mg  q20 min PRN dyspnea, pain. Daughter aware and agreed  Will add ativan 0.5-1 q4 PRN  Goals of Care and Additional Recommendations:  Limitations on Scope of Treatment: Full Comfort  Care  Code Status:    Code Status Orders  (From admission, onward)        Start     Ordered   11/15/17 1509  Do  not attempt resuscitation (DNR)  Continuous    Question Answer Comment  In the event of cardiac or respiratory ARREST Do not call a "code blue"   In the event of cardiac or respiratory ARREST Do not perform Intubation, CPR, defibrillation or ACLS   In the event of cardiac or respiratory ARREST Use medication by any route, position, wound care, and other measures to relive pain and suffering. May use oxygen, suction and manual treatment of airway obstruction as needed for comfort.      11/15/17 1508    Code Status History    Date Active Date Inactive Code Status Order ID Comments User Context   11/11/2017 14:32 11/15/2017 15:08 Full Code [id number]  Laverna PeaceNettey, Shayla D, MD ED   11/02/2017 14:09 11/04/2017 17:12 Full Code [id number]  Shaune Pollackhen, Qing, MD Inpatient       Prognosis:   Hours - Days in the setting of MCA CVA, PNA  Discharge Planning:  Anticipated Hospital Death  Care plan was discussed with bedside RN  Thank you for allowing the Palliative Medicine Team to assist in the care of this patient.   Time In: 0800 Time Out: 0840 Total Time 40 min Prolonged Time Billed  no       Greater than 50%  of this time was spent counseling and coordinating care related to the above assessment and plan.  Irean HongSarah Grace Baneza Bartoszek, NP  Please contact Palliative Medicine Team phone at [phone] for questions and concerns.

## 2017-11-20 ENCOUNTER — Telehealth: Payer: Self-pay | Admitting: Family Medicine

## 2017-11-20 DIAGNOSIS — I714 Abdominal aortic aneurysm, without rupture: Secondary | ICD-10-CM

## 2017-11-20 MED ORDER — SCOPOLAMINE 1 MG/3DAYS TD PT72
1.0000 | MEDICATED_PATCH | TRANSDERMAL | Status: DC
  Administered 2017-11-20: 1.5 mg via TRANSDERMAL
  Filled 2017-11-20: qty 1

## 2017-11-20 NOTE — Progress Notes (Signed)
PROGRESS NOTE    Aaron Rivera  ZOX:[id number] DOB: 09/15/1930 DOA: 03/31/17 PCP: Maple HudsonGilbert, Richard L Jr., MD   Brief Narrative: Aaron Rivera is a 83 y.o. male with a history of systolic/diastolic heart failure, CKD 3, hyperlipidemia, AAA, CAD s/p stent placement, NIDDM. Patient admitted with acute CVA and is now comfort care.   Assessment & Plan:   Principal Problem:   Acute ischemic left MCA stroke (HCC) Active Problems:   AAA (abdominal aortic aneurysm) (HCC)   AI (aortic incompetence)   Benign prostatic hyperplasia with lower urinary tract symptoms   Essential (primary) hypertension   HLD (hyperlipidemia)   Diabetes mellitus, type 2 (HCC)   Carotid stenosis   Chronic systolic CHF (congestive heart failure) (HCC)   Multifocal pneumonia   Left middle cerebral artery stroke [facility](HCC)   Palliative care by specialist   DNR (do not resuscitate)   Dysphagia   Acute ischemic CVA Patient made comfort care secondary to significant dysphagia and mental status change. -Palliative care recommendations: comfort measures; on morphine drip; Robinul   Multifocal pneumonia Secondary to aspiration. Antibiotics discontinued.  Hypokalemia Given potassium. No further lab draws  Combined Systolic/diastolic heart failure Comfort measures  Essential hypertension Comfort measures. Holding outpatient medication regimen  Diabetes mellitus, type 2 Comfort measures  CKD stage III Comfort measures   DVT prophylaxis: None; comfort measures Code Status: DNR; Comfort Family Communication: Wife, daughter, granddaughter Disposition Plan: Anticipate hospital death   Consultants:   Palliative care medicine  Procedures:   Echocardiogram (11/15/2017) Study Conclusions  - Left ventricle: The cavity size was normal. Wall thickness was   normal. Systolic function was moderately to severely reduced. The   estimated ejection fraction was in the range of 30% to 35%.   Diffuse  hypokinesis. The study is not technically sufficient to   allow evaluation of LV diastolic function. - Aortic valve: There was trivial regurgitation. - Left atrium: The atrium was mildly dilated.  Impressions:  - Technically difficult; definity used; moderate to severe global   reduction in LV systolic function; trace AI and MR; mild LAE.  Antimicrobials:  Vancomycin (12/25>>12/29)  Zosyn (12/25>>12/29)    Subjective: No events overnight.  Objective: Vitals:   11/18/17 1700 11/19/17 0336 11/19/17 0628 11/20/17 0626  BP: (!) 150/83  127/69 127/86  Pulse: 95  95 98  Resp: (!) 22  20 20   Temp: 97.7 F (36.5 C)  98.1 F (36.7 C) 98.2 F (36.8 C)  TempSrc: Axillary  Oral Axillary  SpO2: 95%  98% 96%  Weight:  93.9 kg (207 lb 0.2 oz)    Height:        Intake/Output Summary (Last 24 hours) at 11/20/2017 1257 Last data filed at 11/20/2017 [id number] Gross per 24 hour  Intake 0 ml  Output 750 ml  Net -750 ml   Filed Weights   24-Apr-2017 1701 11/18/17 0340 11/19/17 0336  Weight: 92.9 kg (204 lb 12.9 oz) 93 kg (205 lb 0.4 oz) 93.9 kg (207 lb 0.2 oz)    Examination:  General exam: Appears calm and comfortable  Respiratory system: Multiple respiratory pauses Central nervous system: asleep.  Extremities: No edema. No calf tenderness Skin: No cyanosis. No rashes    Data Reviewed: I have personally reviewed following labs and imaging studies  CBC: Recent Labs  Lab 24-Apr-2017 1151 24-Apr-2017 1202  WBC 6.4  --   NEUTROABS 4.0  --   HGB 10.8* 11.6*  HCT 33.8* 34.0*  MCV 93.1  --  PLT 101*  --    Basic Metabolic Panel: Recent Labs  Lab 11/10/2017 1151 11/02/2017 1202 11/15/17 0742 11/16/17 0956 11/17/17 0234  NA 143 147* 144  --  148*  K 3.5 3.5 3.3*  --  4.4  CL 108 110 109  --  115*  CO2 23  --  22  --  20*  GLUCOSE 188* 188* 186*  --  162*  BUN 32* 31* 27*  --  22*  CREATININE 2.66* 2.60* 2.50*  --  2.41*  CALCIUM 8.7*  --  8.5*  --  8.2*  MG  --   --   --   1.7  --    GFR: Estimated Creatinine Clearance: 25.8 mL/min (A) (by C-G formula based on SCr of 2.41 mg/dL (H)). Liver Function Tests: Recent Labs  Lab 11/08/2017 1151  AST 108*  ALT 152*  ALKPHOS 112  BILITOT 1.3*  PROT 7.4  ALBUMIN 3.0*   No results for input(s): LIPASE, AMYLASE in the last 168 hours. No results for input(s): AMMONIA in the last 168 hours. Coagulation Profile: Recent Labs  Lab 11/12/2017 1151  INR 1.49   Cardiac Enzymes: No results for input(s): CKTOTAL, CKMB, CKMBINDEX, TROPONINI in the last 168 hours. BNP (last 3 results) No results for input(s): PROBNP in the last 8760 hours. HbA1C: No results for input(s): HGBA1C in the last 72 hours. CBG: Recent Labs  Lab 11/18/2017 1153  GLUCAP 173*   Lipid Profile: No results for input(s): CHOL, HDL, LDLCALC, TRIG, CHOLHDL, LDLDIRECT in the last 72 hours. Thyroid Function Tests: No results for input(s): TSH, T4TOTAL, FREET4, T3FREE, THYROIDAB in the last 72 hours. Anemia Panel: No results for input(s): VITAMINB12, FOLATE, FERRITIN, TIBC, IRON, RETICCTPCT in the last 72 hours. Sepsis Labs: No results for input(s): PROCALCITON, LATICACIDVEN in the last 168 hours.  Recent Results (from the past 240 hour(s))  MRSA PCR Screening     Status: Abnormal   Collection Time: 11/05/2017  5:44 PM  Result Value Ref Range Status   MRSA by PCR POSITIVE (A) NEGATIVE Final    Comment:        The GeneXpert MRSA Assay (FDA approved for NASAL specimens only), is one component of a comprehensive MRSA colonization surveillance program. It is not intended to diagnose MRSA infection nor to guide or monitor treatment for MRSA infections. RESULT CALLED TO, READ BACK BY AND VERIFIED WITH: RN A DAVIS [id number] [facility]          Radiology Studies: No results found.      Scheduled Meds: . glycopyrrolate  0.4 mg Intravenous Q4H  . mouth rinse  15 mL Mouth Rinse q12n4p   Continuous Infusions: . sodium chloride 75 mL/hr at  11/17/17 2011  . morphine 1 mg/hr (11/19/17 0904)     LOS: 6 days     Jacquelin Hawkingalph Caydn Justen, MD [facility] 11/20/2017, 12:57 PM Pager: [phone]  If 7PM-7AM, please contact night-coverage [email or web address] Password [email or web address] 11/20/2017, 12:57 PM

## 2017-11-20 NOTE — Social Work (Signed)
[facility] Carley Hammedva that [facility], CSW will continue to follow to support transfer when bed is available and discuss other placement options with family.   Doy HutchingIsabel H Arvis Zwahlen, LCSWA [facility] Clinical Social Work [phone]

## 2017-11-20 NOTE — Telephone Encounter (Signed)
Please advise. I see where HCTZ was stopped on 09/27/17 during office visit due to low normal B/P and dizzy spells patient was having. -Consuella LoseAnastasiya V Lyda Colcord, RMA

## 2017-11-20 NOTE — Telephone Encounter (Signed)
Pt had light heart attack on 12/13 from fluid build up.  Stroke 12/25 and Friday was dxed with pneumonia and is only expected to live a few days.   Pt's daughter would like to know if/why he was taken off of his fluid medication.

## 2017-11-21 DEATH — deceased

## 2017-11-22 ENCOUNTER — Telehealth: Payer: Self-pay | Admitting: Family Medicine

## 2017-11-22 NOTE — Telephone Encounter (Signed)
Mr. Aaron Rivera passed away on 11/29/2017.  Pt. Daughter wanted you to know.

## 2017-12-01 ENCOUNTER — Encounter: Payer: Self-pay | Admitting: Oncology

## 2017-12-21 ENCOUNTER — Other Ambulatory Visit: Payer: Self-pay | Admitting: Family Medicine

## 2017-12-21 DIAGNOSIS — E119 Type 2 diabetes mellitus without complications: Secondary | ICD-10-CM

## 2017-12-21 DIAGNOSIS — E785 Hyperlipidemia, unspecified: Secondary | ICD-10-CM

## 2017-12-22 NOTE — Progress Notes (Signed)
Family at bedside this morning and took any belongings with them.  Patient taken to morgue.  Patient placement notified.

## 2017-12-22 NOTE — Progress Notes (Signed)
Found pt unresponsive, no pulse, not breathing. Death confirmed by Trudee GripNellia, RN and Clarita CraneLourdes, RN.  On call for [facility] and family notified.

## 2017-12-22 NOTE — Death Summary Note (Signed)
DEATH SUMMARY   Patient Details  Name: Aaron Rivera MRN: [id number] DOB: Aug 31, 1930  Admission/Discharge Information   Admit Date:  11/25/2017  Date of Death: Date of Death: 12/02/2017  Time of Death: Time of Death: 0642  Length of Stay: 7  Referring Physician: Maple Hudson., MD   Reason(s) for Hospitalization  Acute stroke  Diagnoses  Preliminary cause of death:  Secondary Diagnoses (including complications and co-morbidities):  Principal Problem:   Acute ischemic left MCA stroke [facility]) Active Problems:   AAA (abdominal aortic aneurysm) (HCC)   AI (aortic incompetence)   Benign prostatic hyperplasia with lower urinary tract symptoms   Essential (primary) hypertension   HLD (hyperlipidemia)   Diabetes mellitus, type 2 (HCC)   Carotid stenosis   Chronic systolic CHF (congestive heart failure) (HCC)   Multifocal pneumonia   Left middle cerebral artery stroke [facility])   Palliative care by specialist   DNR (do not resuscitate)   Dysphagia   Brief Hospital Course (including significant findings, care, treatment, and services provided and events leading to death)  Aaron Rivera is a 84 y.o. year old male who presented with acute large left MCA territory stroke with subsequent dysphagia. Feeding tube was considered but ultimately decided against as patient's family felt he would not want to be in such a state of dependence. Patient made comfort care, but unable to transfer to residential hospice secondary to patient instability. Managed on morphine drip with Robinul and morphine bolus as needed.    Pertinent Labs and Studies  Significant Diagnostic Studies Ct Angio Head W Or Wo Contrast  Result Date: 11-25-2017 CLINICAL DATA:  RIGHT-sided weakness.  Found down. EXAM: CT ANGIOGRAPHY HEAD AND NECK TECHNIQUE: Multidetector CT imaging of the head and neck was performed using the standard protocol during bolus administration of intravenous contrast. Multiplanar CT image  reconstructions and MIPs were obtained to evaluate the vascular anatomy. Carotid stenosis measurements (when applicable) are obtained utilizing NASCET criteria, using the distal internal carotid diameter as the denominator. CONTRAST:  50mL ISOVUE-370 IOPAMIDOL (ISOVUE-370) INJECTION 76% COMPARISON:  CT perfusion reported earlier. FINDINGS: CTA NECK Aortic arch: Standard branching. Imaged portion shows no evidence of aneurysm or dissection. No significant stenosis of the major arch vessel origins. Transverse arch calcifications. Right carotid system: High-grade stenosis, estimated 90% or greater in the proximal internal carotid artery due to a combination of calcific plaque and posterior wall soft plaque. There maybe intraluminal thrombus, see image 182 series 6. Left carotid system: Estimated 75-90% stenosis due to combination of calcific and posterior wall soft plaque. There is poor enhancement of the distal cervical ICA due to the intracranial vascular occlusion. Vertebral arteries: Codominant. BILATERAL ostial calcific plaque, 75% or greater. Nonvascular soft tissues: RIGHT upper lobe consolidation, suspected aspiration pneumonia. Large RIGHT pleural effusion. Advanced spondylosis. CTA HEAD Anterior circulation: Calcification of the cavernous internal carotid arteries consistent with cerebrovascular atherosclerotic disease. There is occlusion of the LEFT internal carotid artery at the skullbase, extending to the ICA terminus. No significant filling of the LEFT M1 MCA, LEFT M2 bifurcation segments, or LEFT M3 branches over the convexity. Calcific 50-75% stenosis of the cavernous ICA on the RIGHT. No RIGHT M1 or M2 flow-limiting stenosis. Both anterior cerebral arteries are patent, but attempts of RIGHT-to-LEFT collateral circulation appear ineffective. Posterior circulation: Both vertebral arteries as well as the basilar artery are patent. Attempts at collateral flow via the LEFT PCom appear ineffective. No  significant stenosis, proximal occlusion, aneurysm, or vascular malformation. Venous sinuses:  As permitted by contrast timing, patent. Anatomic variants: None of significance. Delayed phase:  Not performed. Review of the MIP images confirms the above findings IMPRESSION: Emergent large vessel occlusion of the LEFT ICA and LEFT MCA territory. No significant collateral flow to the LEFT hemisphere either RIGHT-to-LEFT or from the posterior circulation. High-grade stenosis of the RIGHT internal carotid artery, 90% or greater. Possible intraluminal thrombus (versus eccentric posterior wall soft plaque) without evidence for distal emboli. Estimated 75-90% stenosis Proximal LEFT internal carotid artery, above which there is poor opacification due to the intracranial occlusion. These results were called by telephone at the time of interpretation on 21-Nov-2017 at 1:06 pm to Dr. Milon Dikes , who verbally acknowledged these results. Electronically Signed   By: Elsie Stain M.D.   On: 12/09/2017 13:07   Dg Chest 2 View  Result Date: 11/02/2017 CLINICAL DATA:  Shortness of Breath EXAM: CHEST  2 VIEW COMPARISON:  September 05, 2013 FINDINGS: There is fibrotic change throughout the lungs bilaterally, greatest in the right upper lobe and basilar regions. Interstitium is somewhat more prominent than on prior CT suggests that there may be a superimposed degree of interstitial edema. There is a small right pleural effusion. There is cardiomegaly with pulmonary venous hypertension. No adenopathy. There is aortic atherosclerosis. There is degenerative change in each shoulder. IMPRESSION: Suspect a degree of interstitial edema superimposed on fibrosis. There is a small right pleural effusion with cardiomegaly and pulmonary venous hypertension. Suspect a degree of congestive heart failure. There is aortic atherosclerosis. No adenopathy appreciable. Aortic Atherosclerosis (ICD10-I70.0). Electronically Signed   By: Bretta Bang  III M.D.   On: 11/02/2017 10:11   Ct Angio Neck W Or Wo Contrast  Result Date: 12/16/2017 CLINICAL DATA:  RIGHT-sided weakness.  Found down. EXAM: CT ANGIOGRAPHY HEAD AND NECK TECHNIQUE: Multidetector CT imaging of the head and neck was performed using the standard protocol during bolus administration of intravenous contrast. Multiplanar CT image reconstructions and MIPs were obtained to evaluate the vascular anatomy. Carotid stenosis measurements (when applicable) are obtained utilizing NASCET criteria, using the distal internal carotid diameter as the denominator. CONTRAST:  50mL ISOVUE-370 IOPAMIDOL (ISOVUE-370) INJECTION 76% COMPARISON:  CT perfusion reported earlier. FINDINGS: CTA NECK Aortic arch: Standard branching. Imaged portion shows no evidence of aneurysm or dissection. No significant stenosis of the major arch vessel origins. Transverse arch calcifications. Right carotid system: High-grade stenosis, estimated 90% or greater in the proximal internal carotid artery due to a combination of calcific plaque and posterior wall soft plaque. There maybe intraluminal thrombus, see image 182 series 6. Left carotid system: Estimated 75-90% stenosis due to combination of calcific and posterior wall soft plaque. There is poor enhancement of the distal cervical ICA due to the intracranial vascular occlusion. Vertebral arteries: Codominant. BILATERAL ostial calcific plaque, 75% or greater. Nonvascular soft tissues: RIGHT upper lobe consolidation, suspected aspiration pneumonia. Large RIGHT pleural effusion. Advanced spondylosis. CTA HEAD Anterior circulation: Calcification of the cavernous internal carotid arteries consistent with cerebrovascular atherosclerotic disease. There is occlusion of the LEFT internal carotid artery at the skullbase, extending to the ICA terminus. No significant filling of the LEFT M1 MCA, LEFT M2 bifurcation segments, or LEFT M3 branches over the convexity. Calcific 50-75% stenosis of  the cavernous ICA on the RIGHT. No RIGHT M1 or M2 flow-limiting stenosis. Both anterior cerebral arteries are patent, but attempts of RIGHT-to-LEFT collateral circulation appear ineffective. Posterior circulation: Both vertebral arteries as well as the basilar artery are patent. Attempts at collateral flow  via the LEFT PCom appear ineffective. No significant stenosis, proximal occlusion, aneurysm, or vascular malformation. Venous sinuses: As permitted by contrast timing, patent. Anatomic variants: None of significance. Delayed phase:  Not performed. Review of the MIP images confirms the above findings IMPRESSION: Emergent large vessel occlusion of the LEFT ICA and LEFT MCA territory. No significant collateral flow to the LEFT hemisphere either RIGHT-to-LEFT or from the posterior circulation. High-grade stenosis of the RIGHT internal carotid artery, 90% or greater. Possible intraluminal thrombus (versus eccentric posterior wall soft plaque) without evidence for distal emboli. Estimated 75-90% stenosis Proximal LEFT internal carotid artery, above which there is poor opacification due to the intracranial occlusion. These results were called by telephone at the time of interpretation on 11/26/2017 at 1:06 pm to Dr. Milon Dikes , who verbally acknowledged these results. Electronically Signed   By: Elsie Stain M.D.   On: November 26, 2017 13:07   Ct Cervical Spine Wo Contrast  Result Date: 26-Nov-2017 CLINICAL DATA:  RIGHT-sided weakness. Symptom duration unspecified. Concern for acute infarction. EXAM: CT HEAD WITHOUT CONTRAST CT CERVICAL SPINE WITHOUT CONTRAST TECHNIQUE: Multidetector CT imaging of the head and cervical spine was performed following the standard protocol without intravenous contrast. Multiplanar CT image reconstructions of the cervical spine were also generated. COMPARISON:  MRI cervical spine 06/30/2017. FINDINGS: CT HEAD FINDINGS Brain: Multifocal areas of cortical hypoattenuation on the LEFT, as well  as loss of the insular ribbon, concerning for acute infarction. No hemorrhage, mass lesion, or extra-axial fluid. Generalized atrophy with hydrocephalus ex vacuo. Vascular: Large vessel occlusion, with dense LEFT ICA and LEFT M1/M2 MCA branches. Skull: Normal. Negative for fracture or focal lesion. Sinuses/Orbits: No acute finding. Other: None. ASPECTS score = 6. Cytotoxic edema of the insula, M2, possible M4 and M6 zones. [place] Stroke Program Early CT Score Normal score = 10 CT CERVICAL SPINE FINDINGS Alignment: Anatomic Skull base and vertebrae: No acute fracture. No primary bone lesion or focal pathologic process. Soft tissues and spinal canal: Severe stenosis at C3-4, combination of OPLL and central disc herniation with posterior element hypertrophy. No visible canal hemorrhage. Disc levels: Multilevel spondylosis. This is better evaluated with prior MRI cervical. Upper chest: Dense opacity in the RIGHT upper lobe, favoring consolidation, query aspiration. No similar findings on the LEFT to suggest asymmetric edema. RIGHT pleural effusion. Other: None. IMPRESSION: CT head revealing emergent large vessel occlusion affecting the LEFT ICA and LEFT M1/M2 MCA. Code stroke ASPECTS score of 6.  CT perfusion to follow. Cervical spondylosis, with multilevel degenerative change and stenosis, which appears similar to prior MR cervical of 06/30/2017. Suspected RIGHT lung aspiration pneumonia.  Large RIGHT effusion. These results were called by telephone at the time of interpretation on 11/26/2017 at 12:20 pm to Dr. Wilford Corner, who verbally acknowledged these results. Electronically Signed   By: Elsie Stain M.D.   On: 2017/11/26 12:34   Ct Pelvis Wo Contrast  Result Date: 26-Nov-2017 CLINICAL DATA:  Patient found lying on floor on right side this morning unresponsive. Right hip pain. EXAM: CT PELVIS WITHOUT CONTRAST TECHNIQUE: Multidetector CT imaging of the pelvis was performed following the standard protocol without  intravenous contrast. COMPARISON:  09/05/2013 FINDINGS: Urinary Tract:  Ureters and bladder are within normal. Bowel: Mild diverticulosis of the colon. Appendix is normal. Visualized small bowel is normal. Vascular/Lymphatic: Aortoiliac stent graft exclude a native abdominal aneurysm measuring 6.8 x 7 cm in AP and transverse dimension unchanged. Moderate calcified plaque over the iliac and femoral arteries. No adenopathy. Reproductive:  Within  normal. Other:  No free fluid or focal inflammatory change. Musculoskeletal: Mild degenerate change of the spine and hips. No evidence of acute hip fracture or dislocation. IMPRESSION: No acute fracture. Aortoiliac stent graft excluding a native abdominal aortic aneurysm measuring 6.8 x 7 cm unchanged. Diverticulosis of the colon. Electronically Signed   By: Elberta Fortis M.D.   On: 10/31/2017 12:50   Mr Brain Wo Contrast  Result Date: 11/05/2017 CLINICAL DATA:  Follow-up code stroke yesterday. EXAM: MRI HEAD WITHOUT CONTRAST TECHNIQUE: Multiplanar, multiecho pulse sequences of the brain and surrounding structures were obtained without intravenous contrast. COMPARISON:  Head CT and CTA from earlier today. FINDINGS: Brain: Essentially the entire cortex of the left MCA distribution is infarcted. There is good correlation with previous CT perfusion. Punctate acute infarct in the right cerebellum. No acute hemorrhage. There is atrophy with ventriculomegaly. Chronic small vessel ischemia with confluent gliosis in the periventricular white matter. Vascular: Loss of left ICA and MCA flow void. The left posterior communicating artery also appears to contain clot. The left posterior communicating artery is large, with hypoplastic left P1 segment. Skull and upper cervical spine: Negative for marrow lesion. Sinuses/Orbits: No acute finding.  Bilateral cataract resection. IMPRESSION: 1. Acute left MCA territory infarct without hemorrhage. 2. Punctate acute infarct in the right  cerebellum. 3. Known left ICA and MCA occlusion. Clot is also present in the left posterior communicating artery. Electronically Signed   By: Marnee Spring M.D.   On: 11/11/2017 16:55   Ct Cerebral Perfusion W Contrast  Result Date: 10/22/2017 CLINICAL DATA:  RIGHT-sided weakness, found down.Last seen normal 12:30 a.m. earlier today. EXAM: CT PERFUSION BRAIN TECHNIQUE: Multiphase CT imaging of the brain was performed following IV bolus contrast injection. Subsequent parametric perfusion maps were calculated using RAPID software. CONTRAST:  40mL ISOVUE-370 IOPAMIDOL (ISOVUE-370) INJECTION 76% COMPARISON:  Code stroke CT earlier today. CTA head neck reported separately. FINDINGS: CT Brain Perfusion Findings: CBF (<30%) Volume: Perfusion (Tmax>6.0s) volume: Mismatch Volume: 60mL Infarction Location:A large portion of the LEFT MCA territory, including frontal, temporal, insula, and anterior parietal lobes, LEFT basal ganglia, and adjacent white matter. IMPRESSION: CT brain perfusion findings indicating a large core MCA territory infarct of 118 mL. There is a moderate penumbra of 60 mL surrounding the core area of ischemia. See discussion above. These results were called by telephone at the time of interpretation on 10/23/2017 at 12:32 pm to Dr. Milon Dikes , who verbally acknowledged these results. Electronically Signed   By: Elsie Stain M.D.   On: 11/20/2017 12:39   Dg Chest Port 1 View  Result Date: 10/21/2017 CLINICAL DATA:  Airspace opacity at the right lung apex for further characterization. EXAM: PORTABLE CHEST 1 VIEW COMPARISON:  10/24/2017 CT neck FINDINGS: Bilateral airspace opacities, right greater than left, with particular consolidation at the right lung apex and also in the right infrahilar region. Hazy density over the right hemithorax likely represents layering of the known right pleural effusion. Atherosclerotic calcification of the aortic arch. Mild cardiomegaly. Streaky left  perihilar and basilar opacities. IMPRESSION: 1. Right greater than left airspace opacities especially in the infrahilar and right apical regions, possibilities include asymmetric edema, multilobar pneumonia, and aspiration pneumonia. 2. Lesser streaky opacities in the left perihilar region and left lower lobe. 3. Mild enlargement of the cardiopericardial silhouette. 4. Known layering right pleural effusion. 5.  Aortic Atherosclerosis (ICD10-I70.0). Electronically Signed   By: Gaylyn Rong M.D.   On: 11/15/2017 13:36   Dg Abd  Portable 1v  Result Date: 11/16/2017 CLINICAL DATA:  Feeding tube placement. EXAM: PORTABLE ABDOMEN - 1 VIEW COMPARISON:  None. FINDINGS: Feeding tube tip is in the distal esophagus just above the gastroesophageal junction. Bowel gas pattern is normal. Aortic stent graft is noted as is the previously demonstrated abdominal aortic aneurysm. IMPRESSION: 1. Feeding tube tip is in the distal esophagus. 2. Stent graft in place in the abdominal aortic aneurysm. Electronically Signed   By: Francene Boyers M.D.   On: 11/16/2017 13:48   Ct Head Code Stroke Wo Contrast  Result Date: 11/20/2017 CLINICAL DATA:  RIGHT-sided weakness. Symptom duration unspecified. Concern for acute infarction. EXAM: CT HEAD WITHOUT CONTRAST CT CERVICAL SPINE WITHOUT CONTRAST TECHNIQUE: Multidetector CT imaging of the head and cervical spine was performed following the standard protocol without intravenous contrast. Multiplanar CT image reconstructions of the cervical spine were also generated. COMPARISON:  MRI cervical spine 06/30/2017. FINDINGS: CT HEAD FINDINGS Brain: Multifocal areas of cortical hypoattenuation on the LEFT, as well as loss of the insular ribbon, concerning for acute infarction. No hemorrhage, mass lesion, or extra-axial fluid. Generalized atrophy with hydrocephalus ex vacuo. Vascular: Large vessel occlusion, with dense LEFT ICA and LEFT M1/M2 MCA branches. Skull: Normal. Negative for  fracture or focal lesion. Sinuses/Orbits: No acute finding. Other: None. ASPECTS score = 6. Cytotoxic edema of the insula, M2, possible M4 and M6 zones. [place] Stroke Program Early CT Score Normal score = 10 CT CERVICAL SPINE FINDINGS Alignment: Anatomic Skull base and vertebrae: No acute fracture. No primary bone lesion or focal pathologic process. Soft tissues and spinal canal: Severe stenosis at C3-4, combination of OPLL and central disc herniation with posterior element hypertrophy. No visible canal hemorrhage. Disc levels: Multilevel spondylosis. This is better evaluated with prior MRI cervical. Upper chest: Dense opacity in the RIGHT upper lobe, favoring consolidation, query aspiration. No similar findings on the LEFT to suggest asymmetric edema. RIGHT pleural effusion. Other: None. IMPRESSION: CT head revealing emergent large vessel occlusion affecting the LEFT ICA and LEFT M1/M2 MCA. Code stroke ASPECTS score of 6.  CT perfusion to follow. Cervical spondylosis, with multilevel degenerative change and stenosis, which appears similar to prior MR cervical of 06/30/2017. Suspected RIGHT lung aspiration pneumonia.  Large RIGHT effusion. These results were called by telephone at the time of interpretation on 11/09/2017 at 12:20 pm to Dr. Wilford Corner, who verbally acknowledged these results. Electronically Signed   By: Elsie Stain M.D.   On: 11/15/2017 12:34    Microbiology Recent Results (from the past 240 hour(s))  MRSA PCR Screening     Status: Abnormal   Collection Time: 10/22/2017  5:44 PM  Result Value Ref Range Status   MRSA by PCR POSITIVE (A) NEGATIVE Final    Comment:        The GeneXpert MRSA Assay (FDA approved for NASAL specimens only), is one component of a comprehensive MRSA colonization surveillance program. It is not intended to diagnose MRSA infection nor to guide or monitor treatment for MRSA infections. RESULT CALLED TO, READ BACK BY AND VERIFIED WITH: RN A DAVIS [id number] MLM      Lab Basic Metabolic Panel: Recent Labs  Lab 11/06/2017 1151 11/06/2017 1202 11/15/17 0742 11/16/17 0956 11/17/17 0234  NA 143 147* 144  --  148*  K 3.5 3.5 3.3*  --  4.4  CL 108 110 109  --  115*  CO2 23  --  22  --  20*  GLUCOSE 188* 188* 186*  --  162*  BUN 32* 31* 27*  --  22*  CREATININE 2.66* 2.60* 2.50*  --  2.41*  CALCIUM 8.7*  --  8.5*  --  8.2*  MG  --   --   --  1.7  --    Liver Function Tests: Recent Labs  Lab 11/02/2017 1151  AST 108*  ALT 152*  ALKPHOS 112  BILITOT 1.3*  PROT 7.4  ALBUMIN 3.0*   No results for input(s): LIPASE, AMYLASE in the last 168 hours. No results for input(s): AMMONIA in the last 168 hours. CBC: Recent Labs  Lab 10/26/2017 1151 11/03/2017 1202  WBC 6.4  --   NEUTROABS 4.0  --   HGB 10.8* 11.6*  HCT 33.8* 34.0*  MCV 93.1  --   PLT 101*  --    Cardiac Enzymes: No results for input(s): CKTOTAL, CKMB, CKMBINDEX, TROPONINI in the last 168 hours. Sepsis Labs: Recent Labs  Lab 10/30/2017 1151  WBC 6.4    Procedures/Operations   Echocardiogram (11/15/2017) Study Conclusions  - Left ventricle: The cavity size was normal. Wall thickness was   normal. Systolic function was moderately to severely reduced. The   estimated ejection fraction was in the range of 30% to 35%.   Diffuse hypokinesis. The study is not technically sufficient to   allow evaluation of LV diastolic function. - Aortic valve: There was trivial regurgitation. - Left atrium: The atrium was mildly dilated.  Impressions:  - Technically difficult; definity used; moderate to severe global   reduction in LV systolic function; trace AI and MR; mild LAE.   Jacquelin HawkingRalph Latayvia Mandujano 12/18/2017, 7:52 AM

## 2017-12-22 DEATH — deceased

## 2018-03-22 ENCOUNTER — Ambulatory Visit (INDEPENDENT_AMBULATORY_CARE_PROVIDER_SITE_OTHER): Payer: Medicare HMO | Admitting: Vascular Surgery

## 2018-03-22 ENCOUNTER — Encounter (INDEPENDENT_AMBULATORY_CARE_PROVIDER_SITE_OTHER): Payer: Medicare HMO

## 2018-03-27 ENCOUNTER — Ambulatory Visit: Payer: Medicare HMO | Admitting: Family Medicine

## 2019-12-23 IMAGING — CT CT ANGIO HEAD
2 of 7 series · 8 of 33 positions shown · IV contrast (APPLIED)
Comparison: CT perfusion reported earlier.

CLINICAL DATA: RIGHT-sided weakness.  Found down.

EXAM:
CT ANGIOGRAPHY HEAD AND NECK
TECHNIQUE: Multidetector CT imaging of the head and neck was performed using
the standard protocol during bolus administration of intravenous
contrast. Multiplanar CT image reconstructions and MIPs were
obtained to evaluate the vascular anatomy. Carotid stenosis
measurements (when applicable) are obtained utilizing NASCET
criteria, using the distal internal carotid diameter as the
denominator.
CONTRAST:  50mL [id number] IOPAMIDOL ([id number]) INJECTION 76%

[Series 4: cta neck/head · axial · 0.51mm/px · z∈[-214,-98]mm · 2 of 174 slices shown]
[im 58/174  soft-tissue]
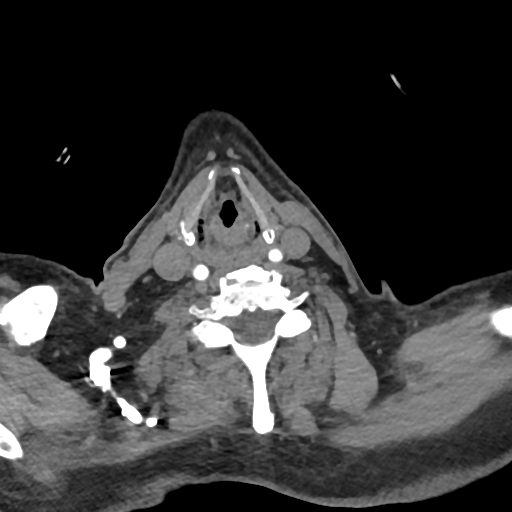
[im 116/174  soft-tissue]
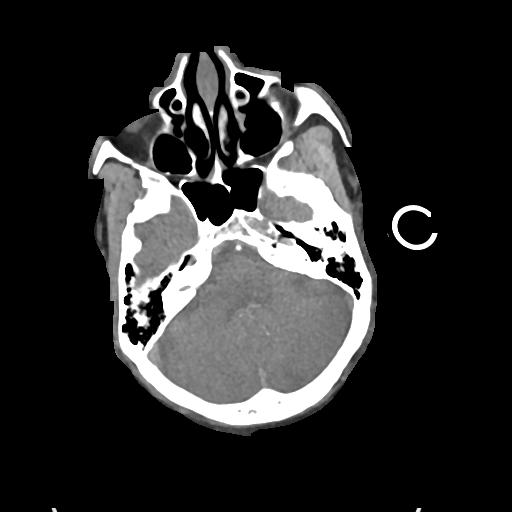

[Series 6: ax thins · axial · 0.54mm/px · z∈[-290,-42]mm · 6 of 348 slices shown]
[im 50/348  soft-tissue]
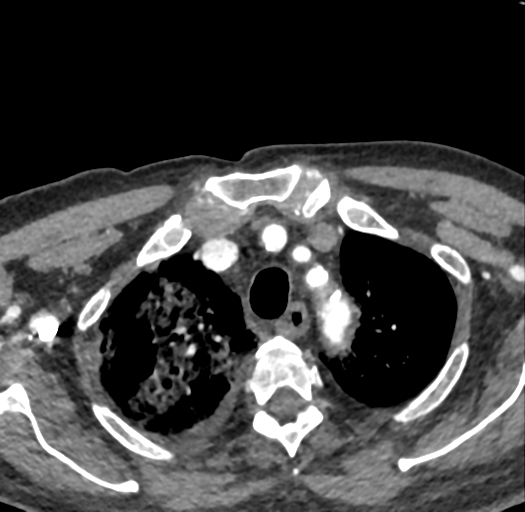
[im 100/348  bone]
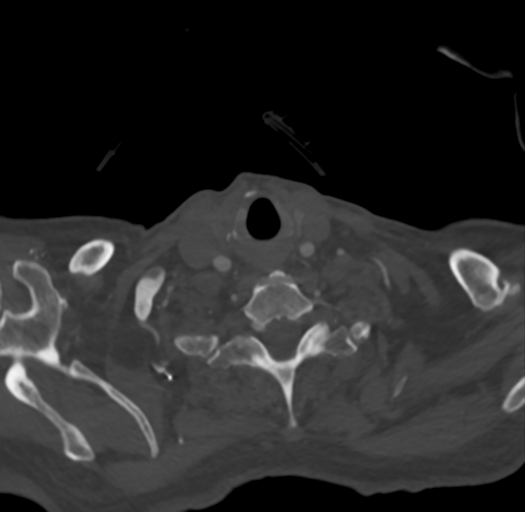
[im 149/348  soft-tissue]
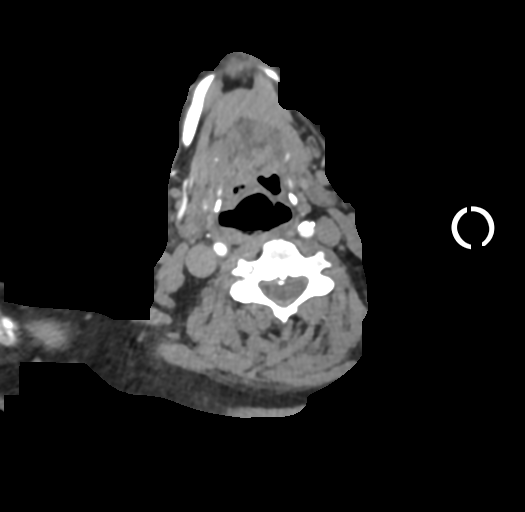
[im 199/348  bone]
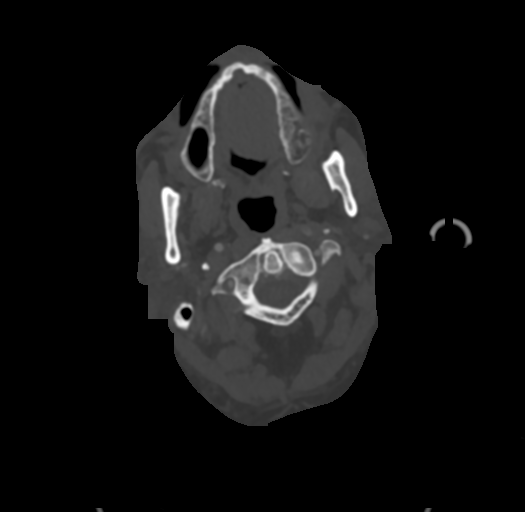
[im 248/348  soft-tissue]
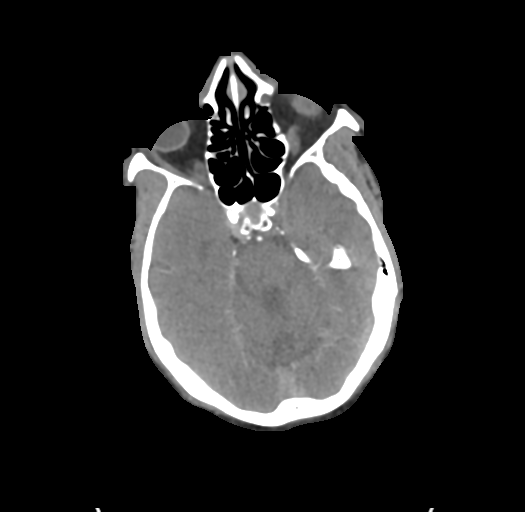
[im 298/348  bone]
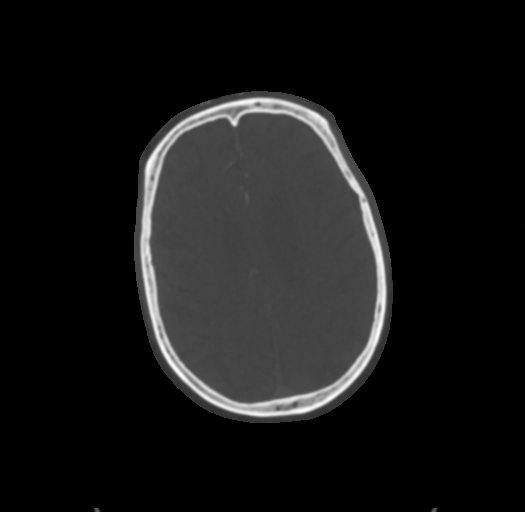

[8 of 33 positions shown; findings below may reference images not displayed]

FINDINGS: CTA NECK

Aortic arch: Standard branching. Imaged portion shows no evidence of
aneurysm or dissection. No significant stenosis of the major arch
vessel origins. Transverse arch calcifications.

Right carotid system: High-grade stenosis, estimated 90% or greater
in the proximal internal carotid artery due to a combination of
calcific plaque and posterior wall soft plaque. There maybe
intraluminal thrombus, see image 182 series 6.

Left carotid system: Estimated 75-90% stenosis due to combination of
calcific and posterior wall soft plaque. There is poor enhancement
of the distal cervical ICA due to the intracranial vascular
occlusion.

Vertebral arteries: Codominant. BILATERAL ostial calcific plaque,
75% or greater.

Nonvascular soft tissues: RIGHT upper lobe consolidation, suspected
aspiration pneumonia. Large RIGHT pleural effusion. Advanced
spondylosis.

CTA HEAD

Anterior circulation: Calcification of the cavernous internal
carotid arteries consistent with cerebrovascular atherosclerotic
disease. There is occlusion of the LEFT internal carotid artery at
the skullbase, extending to the ICA terminus. No significant filling
of the LEFT M1 MCA, LEFT M2 bifurcation segments, or LEFT M3
branches over the convexity. Calcific 50-75% stenosis of the
cavernous ICA on the RIGHT. No RIGHT M1 or M2 flow-limiting
stenosis. Both anterior cerebral arteries are patent, but attempts
of RIGHT-to-LEFT collateral circulation appear ineffective.

Posterior circulation: Both vertebral arteries as well as the
basilar artery are patent. Attempts at collateral flow via the LEFT
PCom appear ineffective. No significant stenosis, proximal
occlusion, aneurysm, or vascular malformation.

Venous sinuses: As permitted by contrast timing, patent.

Anatomic variants: None of significance.

Delayed phase:  Not performed.

Review of the MIP images confirms the above findings
IMPRESSION: Emergent large vessel occlusion of the LEFT ICA and LEFT MCA
territory.

No significant collateral flow to the LEFT hemisphere either
RIGHT-to-LEFT or from the posterior circulation.

High-grade stenosis of the RIGHT internal carotid artery, 90% or
greater. Possible intraluminal thrombus (versus eccentric posterior
wall soft plaque) without evidence for distal emboli.

Estimated 75-90% stenosis Proximal LEFT internal carotid artery,
above which there is poor opacification due to the intracranial
occlusion.

These results were called by telephone at the time of interpretation
on 11/14/2017 at [DATE] to Dr. JAE-YONG JERABKOVA , who verbally
acknowledged these results.

## 2019-12-23 IMAGING — CT CT PELVIS W/O CM
2 of 5 series · 15 of 46 positions shown, 18 images · non-contrast
Comparison: 09/05/2013

CLINICAL DATA: Patient found lying on floor on right side this
morning unresponsive. Right hip pain.

EXAM:
CT PELVIS WITHOUT CONTRAST
TECHNIQUE: Multidetector CT imaging of the pelvis was performed following the
standard protocol without intravenous contrast.

[Series 3: soft tissue · axial · 0.74mm/px · z∈[-872,-637]mm · 12 of 53 slices shown, 15 images]
[im 4/53  soft-tissue]
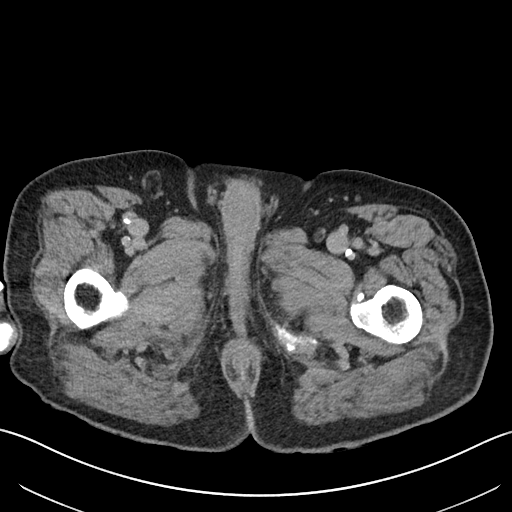
[im 4/53  bone]
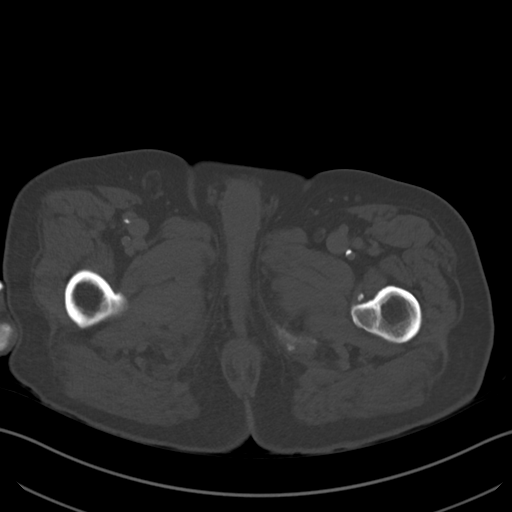
[im 11/53  soft-tissue]
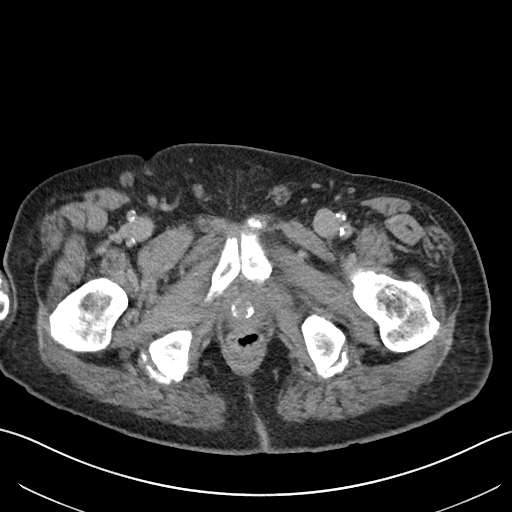
[im 16/53  soft-tissue]
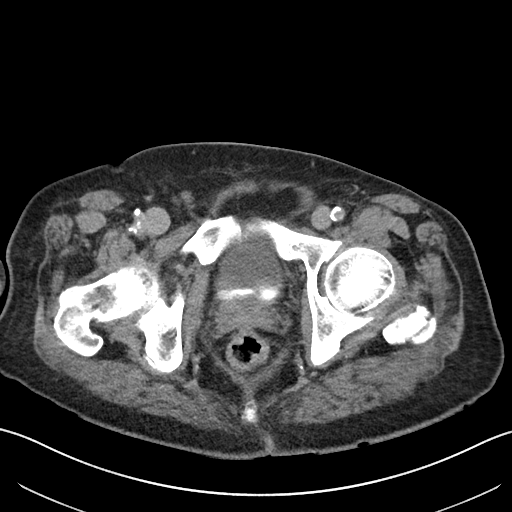
[im 21/53  soft-tissue]
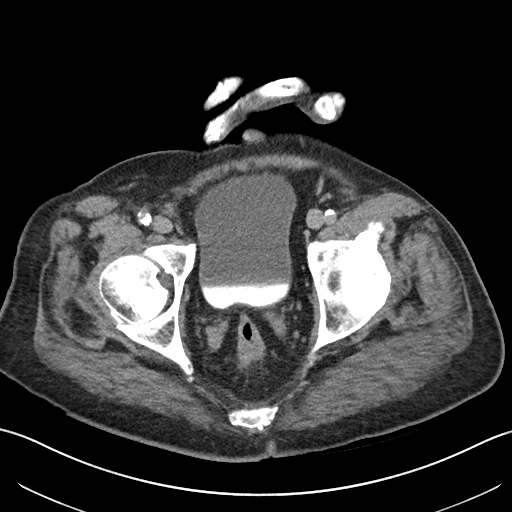
[im 27/53  soft-tissue]
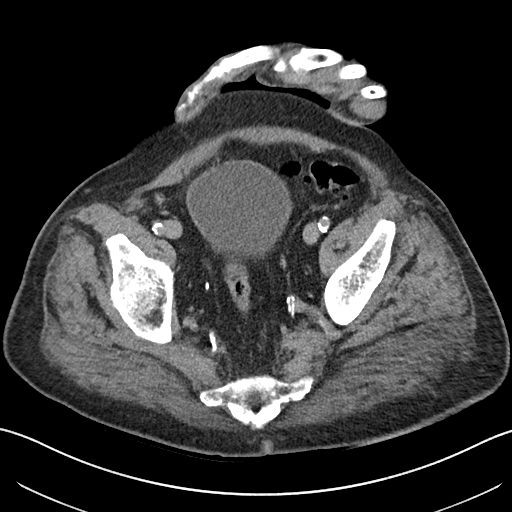
[im 32/53  soft-tissue]
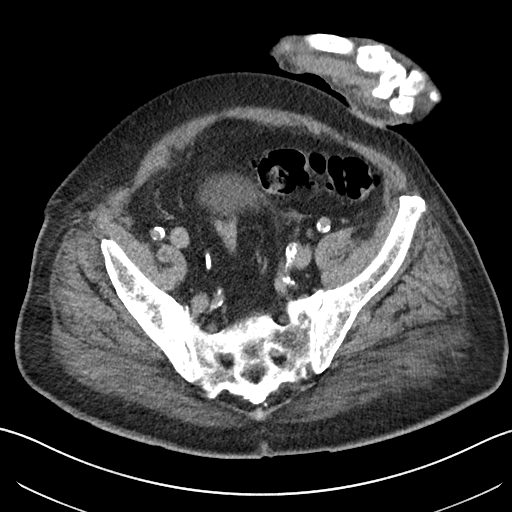
[im 37/53  soft-tissue]
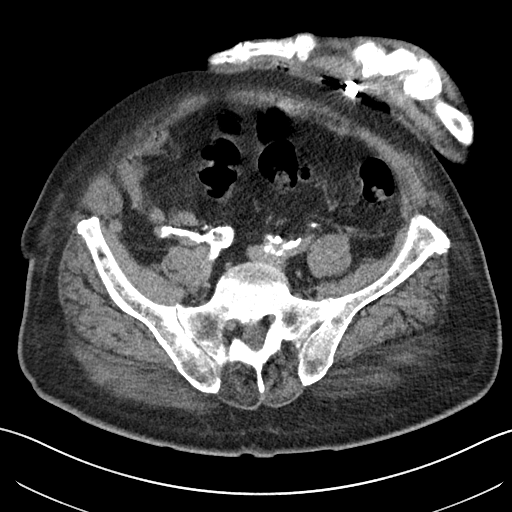
[im 44/53  soft-tissue]
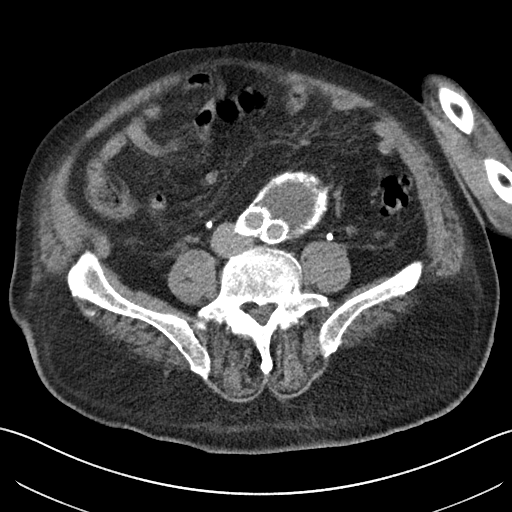
[im 46/53  lung]
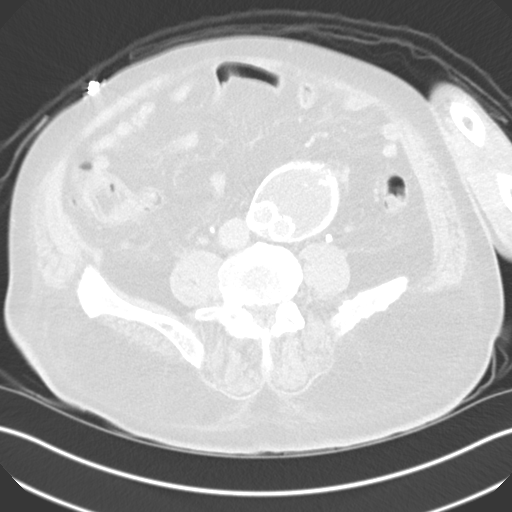
[im 47/53  lung]
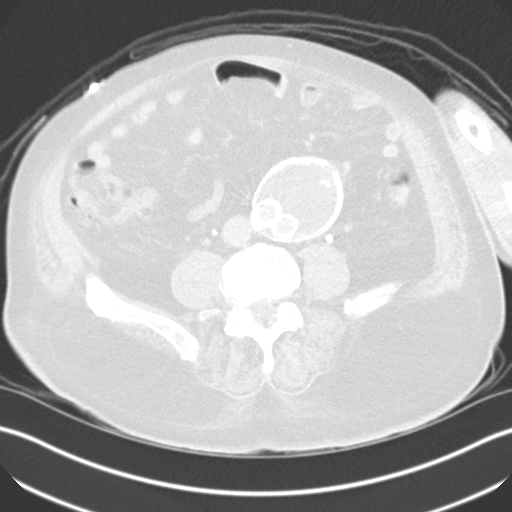
[im 49/53  soft-tissue]
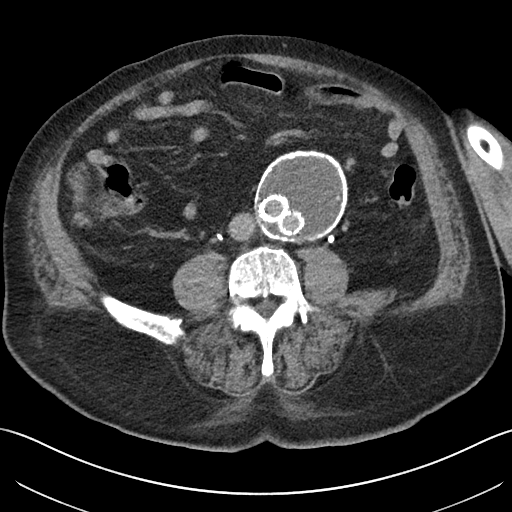
[im 49/53  lung]
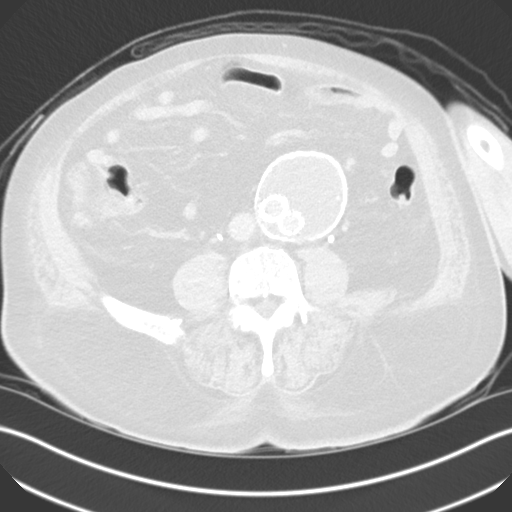
[im 49/53  bone]
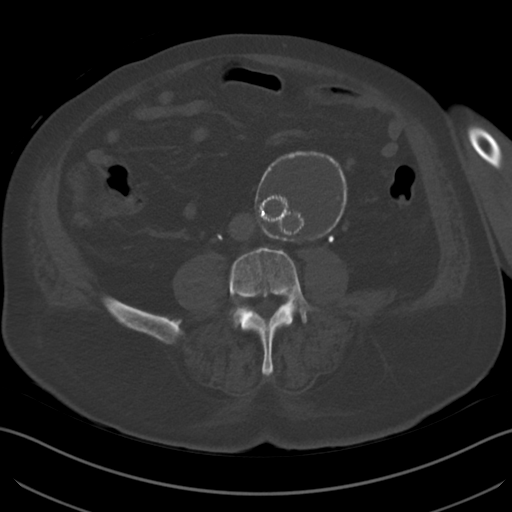
[im 51/53  lung]
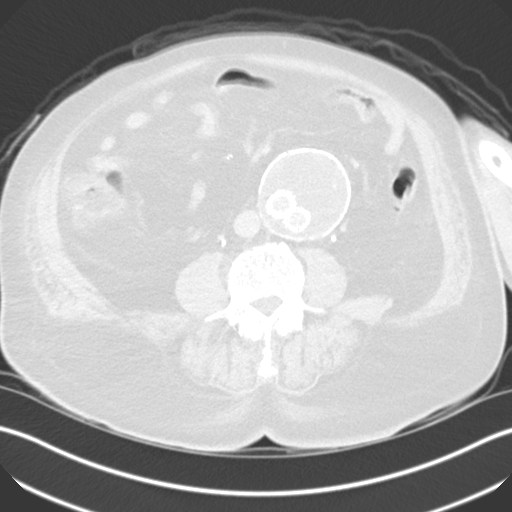

[Series 5: cor st · coronal · 0.55mm/px · 3 of 124 slices shown]
[im 31/124  soft-tissue]
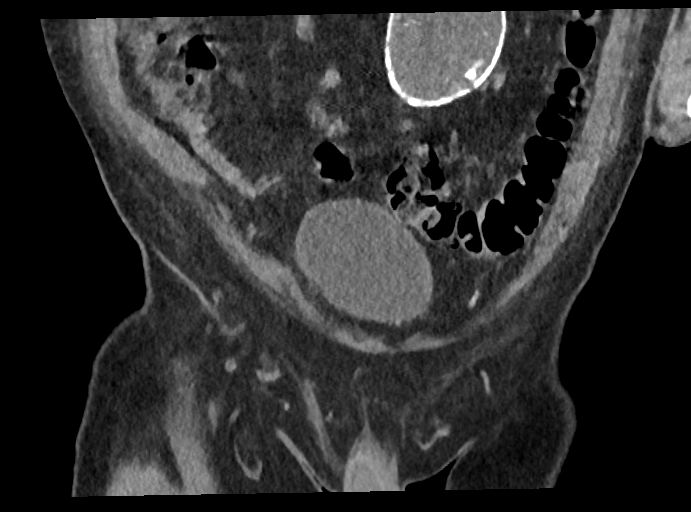
[im 62/124  soft-tissue]
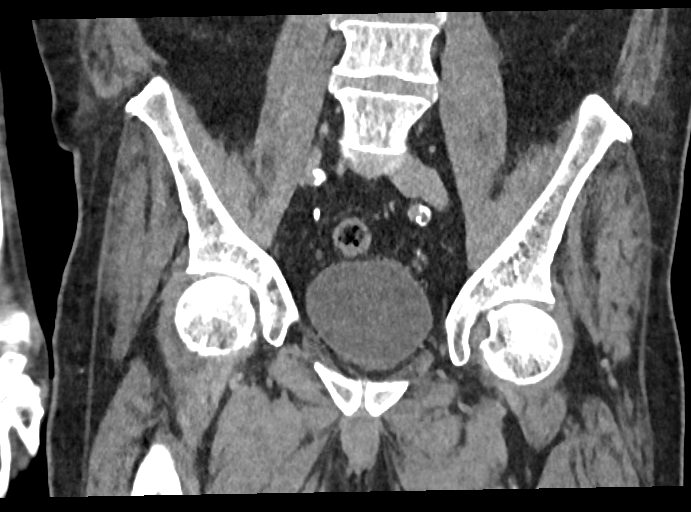
[im 93/124  soft-tissue]
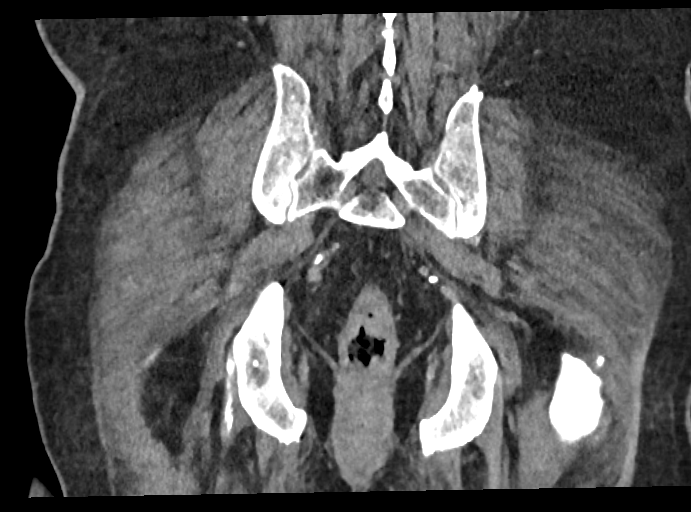

[15 of 46 positions shown; findings below may reference images not displayed]

FINDINGS: Urinary Tract:  Ureters and bladder are within normal.

Bowel: Mild diverticulosis of the colon. Appendix is normal.
Visualized small bowel is normal.

Vascular/Lymphatic: Aortoiliac stent graft exclude a native
abdominal aneurysm measuring 6.8 x 7 cm in AP and transverse
dimension unchanged. Moderate calcified plaque over the iliac and
femoral arteries. No adenopathy.

Reproductive:  Within normal.

Other:  No free fluid or focal inflammatory change.

Musculoskeletal: Mild degenerate change of the spine and hips. No
evidence of acute hip fracture or dislocation.
IMPRESSION: No acute fracture.

Aortoiliac stent graft excluding a native abdominal aortic aneurysm
measuring 6.8 x 7 cm unchanged.

Diverticulosis of the colon.

## 2019-12-25 IMAGING — DX DG ABD PORTABLE 1V
1 series · 1 of 1 positions shown · non-contrast
Comparison: None.

CLINICAL DATA: Feeding tube placement.

EXAM:
PORTABLE ABDOMEN - 1 VIEW

[abdomen kub]
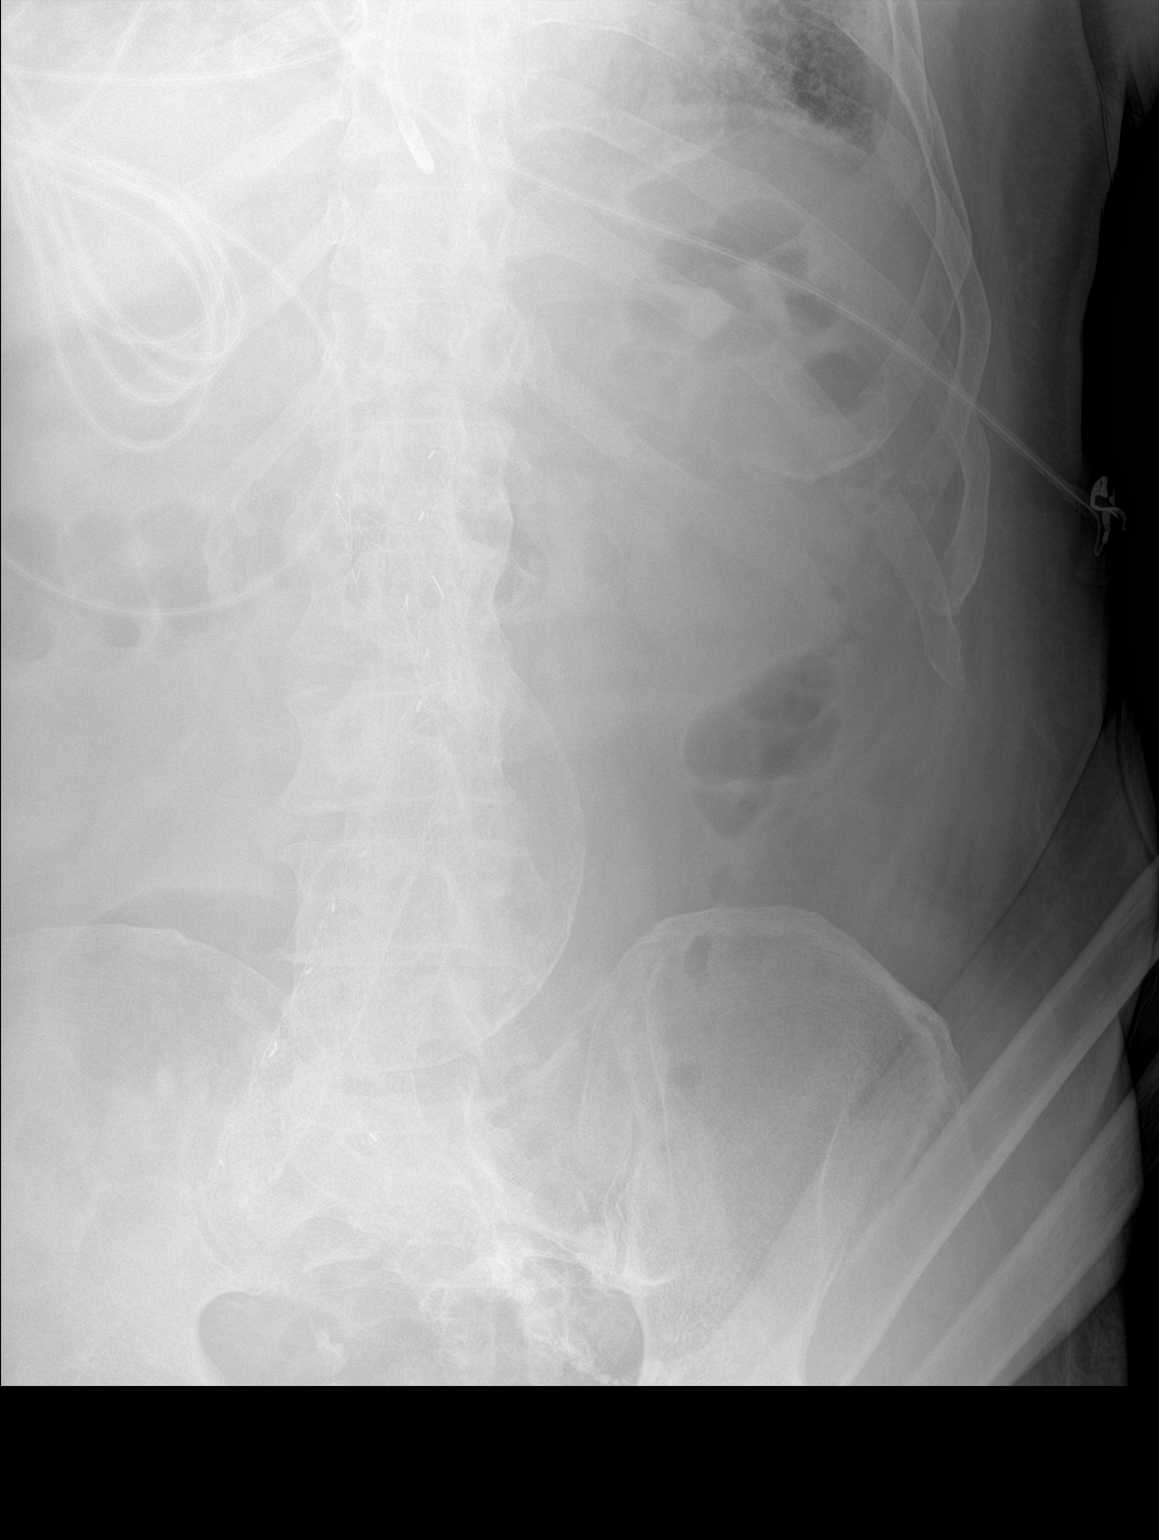

[1 of 1 positions shown; findings below may reference images not displayed]

FINDINGS: Feeding tube tip is in the distal esophagus just above the
gastroesophageal junction.

Bowel gas pattern is normal. Aortic stent graft is noted as is the
previously demonstrated abdominal aortic aneurysm.
IMPRESSION: 1. Feeding tube tip is in the distal esophagus.
2. Stent graft in place in the abdominal aortic aneurysm.
